# Patient Record
Sex: Male | Born: 2000 | Race: Black or African American | Hispanic: Yes | Marital: Single | Smoking: Never smoker
Health system: Southern US, Community
[De-identification: ages and names within clinical notes are randomized; demographics above are authoritative.]

---
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
## 2019-07-25 ENCOUNTER — Other Ambulatory Visit: Payer: Self-pay

## 2019-07-25 ENCOUNTER — Ambulatory Visit (HOSPITAL_COMMUNITY)
Admission: EM | Admit: 2019-07-25 | Discharge: 2019-07-25 | Disposition: A | Discharge status: Home / Self Care | Payer: 59 | Attending: Family Medicine | Admitting: Family Medicine

## 2019-07-25 ENCOUNTER — Encounter (HOSPITAL_COMMUNITY): Payer: Self-pay

## 2019-07-25 DIAGNOSIS — H6982 Other specified disorders of Eustachian tube, left ear: Secondary | ICD-10-CM

## 2019-07-25 MED ORDER — FLUTICASONE PROPIONATE 50 MCG/ACT NA SUSP: 2.0000 | Freq: Every day | NASAL | 0 refills | Status: AC

## 2019-07-25 NOTE — ED Triage Notes (Signed)
Pt state he has left ear pain and sinus pressure. Pt states he thinks he has a ear infection. X 5 days.

## 2019-07-25 NOTE — Discharge Instructions (Addendum)
Drink plenty of fluids Use the flonase during allergy season Return as needed

## 2019-07-25 NOTE — ED Provider Notes (Signed)
[facility]    CSN: [id number] Arrival date & time: 07/25/19  1149      History   Chief Complaint Chief Complaint  Patient presents with  . Appointment    1230  . ear pain    HPI John Burton is a 19 y.o. male.   HPI   Sinus pressure and mild allergy symptoms chronically Now with ear pain for 5 d on L Pain comes and goes Hearing slightly down No fever or chills No purulent drainage   History reviewed. No pertinent past medical history.  Patient Active Problem List   Diagnosis Date Noted  . Appendicitis 04/18/2014  . Acute appendicitis 04/18/2014    Past Surgical History:  Procedure Laterality Date  . LAPAROSCOPIC APPENDECTOMY N/A 04/18/2014   Procedure: APPENDECTOMY LAPAROSCOPIC;  Surgeon: Judie Petit. Leonia Corona, MD;  Location: [facility];  Service: Pediatrics;  Laterality: N/A;       Home Medications    Prior to Admission medications   Medication Sig Start Date End Date Taking? Authorizing Provider  Chlorpheniramine Maleate (ALLERGY PO) Take 2 tablets by mouth daily as needed (allergies).    [provider]  fluticasone (FLONASE) 50 MCG/ACT nasal spray Place 2 sprays into both nostrils daily. 07/25/19   Eustace Moore, MD    Family History History reviewed. No pertinent family history.  Social History Social History   Tobacco Use  . Smoking status: Never Smoker  . Smokeless tobacco: Never Used  Substance Use Topics  . Alcohol use: No  . Drug use: No     Allergies   Shellfish allergy   Review of Systems Review of Systems  Constitutional: Negative for chills and fever.  HENT: Positive for congestion, ear pain, hearing loss, rhinorrhea and sinus pressure. Negative for sore throat.   Respiratory: Negative for cough.   Musculoskeletal: Negative for myalgias.  Neurological: Negative for headaches.     Physical Exam Triage Vital Signs ED Triage Vitals  Enc Vitals Group     BP 07/25/19 1209 131/70     Pulse Rate  07/25/19 1209 93     Resp 07/25/19 1209 16     Temp 07/25/19 1209 98.4 F (36.9 C)     Temp Source 07/25/19 1209 Oral     SpO2 07/25/19 1209 98 %     Weight 07/25/19 1208 176 lb (79.8 kg)     Height --      Head Circumference --      Peak Flow --      Pain Score 07/25/19 1208 4     Pain Loc --      Pain Edu? --      Excl. in GC? --    No data found.  Updated Vital Signs BP 131/70 (BP Location: Right Arm)   Pulse 93   Temp 98.4 F (36.9 C) (Oral)   Resp 16   Wt 79.8 kg   SpO2 98%      Physical Exam Constitutional:      General: He is not in acute distress.    Appearance: He is well-developed and normal weight.  HENT:     Head: Normocephalic and atraumatic.     Right Ear: Tympanic membrane, ear canal and external ear normal.     Left Ear: Tympanic membrane, ear canal and external ear normal.     Nose: Congestion present.     Mouth/Throat:     Mouth: Mucous membranes are moist.     Pharynx: No posterior oropharyngeal  erythema.  Eyes:     Conjunctiva/sclera: Conjunctivae normal.     Pupils: Pupils are equal, round, and reactive to light.  Cardiovascular:     Rate and Rhythm: Normal rate.  Pulmonary:     Effort: Pulmonary effort is normal. No respiratory distress.  Musculoskeletal:        General: Normal range of motion.     Cervical back: Normal range of motion.  Lymphadenopathy:     Cervical: No cervical adenopathy.  Skin:    General: Skin is warm and dry.  Neurological:     Mental Status: He is alert.  Psychiatric:        Mood and Affect: Mood normal.        Behavior: Behavior normal.      UC Treatments / Results  Labs (all labs ordered are listed, but only abnormal results are displayed) Labs Reviewed - No data to display  EKG   Radiology No results found.  Procedures Procedures (including critical care time)  Medications Ordered in UC Medications - No data to display  Initial Impression / Assessment and Plan / UC Course  I have reviewed  the triage vital signs and the nursing notes.  Pertinent labs & imaging results that were available during my care of the patient were reviewed by me and considered in my medical decision making (see chart for details).     TMs are clear Final Clinical Impressions(s) / UC Diagnoses   Final diagnoses:  Eustachian tube dysfunction, left     Discharge Instructions     Drink plenty of fluids Use the flonase during allergy season Return as needed   ED Prescriptions    Medication Sig Dispense Auth. Provider   fluticasone (FLONASE) 50 MCG/ACT nasal spray Place 2 sprays into both nostrils daily. 16 g Raylene Everts, MD     PDMP not reviewed this encounter.   Raylene Everts, MD 07/25/19 1320

## 2021-02-19 ENCOUNTER — Encounter (HOSPITAL_COMMUNITY): Payer: Self-pay | Admitting: Psychiatry

## 2021-02-19 ENCOUNTER — Telehealth (HOSPITAL_COMMUNITY): Payer: 59 | Admitting: Psychiatry

## 2021-02-19 ENCOUNTER — Telehealth (INDEPENDENT_AMBULATORY_CARE_PROVIDER_SITE_OTHER): Payer: 59 | Admitting: Psychiatry

## 2021-02-19 DIAGNOSIS — F250 Schizoaffective disorder, bipolar type: Secondary | ICD-10-CM

## 2021-02-19 DIAGNOSIS — F411 Generalized anxiety disorder: Secondary | ICD-10-CM

## 2021-02-19 MED ORDER — FLUOXETINE HCL 10 MG PO CAPS: 10.0000 mg | ORAL_CAPSULE | Freq: Every day | ORAL | 3 refills | Status: DC

## 2021-02-19 MED ORDER — HYDROXYZINE HCL 10 MG PO TABS: 10.0000 mg | ORAL_TABLET | Freq: Three times a day (TID) | ORAL | 3 refills | Status: DC | PRN

## 2021-02-19 MED ORDER — DIVALPROEX SODIUM ER 500 MG PO TB24: 500.0000 mg | ORAL_TABLET | Freq: Three times a day (TID) | ORAL | 3 refills | Status: DC

## 2021-02-19 MED ORDER — PALIPERIDONE PALMITATE ER 156 MG/ML IM SUSY: 156.0000 mg | PREFILLED_SYRINGE | Freq: Once | INTRAMUSCULAR | Status: DC

## 2021-02-19 MED ORDER — INVEGA SUSTENNA 156 MG/ML IM SUSY: 156.0000 mg | PREFILLED_SYRINGE | INTRAMUSCULAR | 11 refills | Status: DC
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
## 2021-03-07 ENCOUNTER — Telehealth (HOSPITAL_COMMUNITY): Payer: Self-pay | Admitting: Psychiatry

## 2021-03-07 ENCOUNTER — Other Ambulatory Visit: Payer: Self-pay

## 2021-03-07 ENCOUNTER — Ambulatory Visit (HOSPITAL_COMMUNITY)
Admission: EM | Admit: 2021-03-07 | Discharge: 2021-03-09 | Disposition: A | Discharge status: Home / Self Care | Payer: 59 | Attending: Registered Nurse | Admitting: Registered Nurse

## 2021-03-07 ENCOUNTER — Encounter (HOSPITAL_COMMUNITY): Payer: Self-pay | Admitting: Registered Nurse

## 2021-03-07 DIAGNOSIS — F250 Schizoaffective disorder, bipolar type: Secondary | ICD-10-CM | POA: Diagnosis present

## 2021-03-07 DIAGNOSIS — F411 Generalized anxiety disorder: Secondary | ICD-10-CM | POA: Diagnosis present

## 2021-03-07 DIAGNOSIS — Y93E1 Activity, personal bathing and showering: Secondary | ICD-10-CM | POA: Insufficient documentation

## 2021-03-07 DIAGNOSIS — F1290 Cannabis use, unspecified, uncomplicated: Secondary | ICD-10-CM | POA: Diagnosis not present

## 2021-03-07 DIAGNOSIS — Z20822 Contact with and (suspected) exposure to covid-19: Secondary | ICD-10-CM | POA: Diagnosis not present

## 2021-03-07 DIAGNOSIS — G8929 Other chronic pain: Secondary | ICD-10-CM | POA: Diagnosis not present

## 2021-03-07 DIAGNOSIS — Y929 Unspecified place or not applicable: Secondary | ICD-10-CM | POA: Diagnosis not present

## 2021-03-07 DIAGNOSIS — F17210 Nicotine dependence, cigarettes, uncomplicated: Secondary | ICD-10-CM | POA: Insufficient documentation

## 2021-03-07 DIAGNOSIS — Z79899 Other long term (current) drug therapy: Secondary | ICD-10-CM | POA: Diagnosis not present

## 2021-03-07 DIAGNOSIS — M545 Low back pain, unspecified: Secondary | ICD-10-CM | POA: Insufficient documentation

## 2021-03-07 DIAGNOSIS — W182XXA Fall in (into) shower or empty bathtub, initial encounter: Secondary | ICD-10-CM | POA: Insufficient documentation

## 2021-03-07 LAB — CBC WITH DIFFERENTIAL/PLATELET
Abs Immature Granulocytes: 0.03 10*3/uL (ref 0.00–0.07)
Basophils Absolute: 0 10*3/uL (ref 0.0–0.1)
Basophils Relative: 0 %
Eosinophils Absolute: 0.1 10*3/uL (ref 0.0–0.5)
Eosinophils Relative: 2 %
HCT: 42.6 % (ref 39.0–52.0)
Hemoglobin: 14.2 g/dL (ref 13.0–17.0)
Immature Granulocytes: 0 %
Lymphocytes Relative: 16 %
Lymphs Abs: 1.1 10*3/uL (ref 0.7–4.0)
MCH: 30.9 pg (ref 26.0–34.0)
MCHC: 33.3 g/dL (ref 30.0–36.0)
MCV: 92.6 fL (ref 80.0–100.0)
Monocytes Absolute: 1.3 10*3/uL — ABNORMAL HIGH (ref 0.1–1.0)
Monocytes Relative: 19 %
Neutro Abs: 4.3 10*3/uL (ref 1.7–7.7)
Neutrophils Relative %: 63 %
Platelets: 195 10*3/uL (ref 150–400)
RBC: 4.6 MIL/uL (ref 4.22–5.81)
RDW: 14.2 % (ref 11.5–15.5)
WBC: 6.8 10*3/uL (ref 4.0–10.5)
nRBC: 0 % (ref 0.0–0.2)

## 2021-03-07 LAB — COMPREHENSIVE METABOLIC PANEL
ALT: 22 U/L (ref 0–44)
AST: 36 U/L (ref 15–41)
Albumin: 3.9 g/dL (ref 3.5–5.0)
Alkaline Phosphatase: 65 U/L (ref 38–126)
Anion gap: 9 (ref 5–15)
BUN: 22 mg/dL — ABNORMAL HIGH (ref 6–20)
CO2: 24 mmol/L (ref 22–32)
Calcium: 9.1 mg/dL (ref 8.9–10.3)
Chloride: 105 mmol/L (ref 98–111)
Creatinine, Ser: 1.21 mg/dL (ref 0.61–1.24)
GFR, Estimated: >60 mL/min (ref >60)
Glucose, Bld: 109 mg/dL — ABNORMAL HIGH (ref 70–99)
Potassium: 4.2 mmol/L (ref 3.5–5.1)
Sodium: 138 mmol/L (ref 135–145)
Total Bilirubin: 0.7 mg/dL (ref 0.3–1.2)
Total Protein: 6.1 g/dL — ABNORMAL LOW (ref 6.5–8.1)

## 2021-03-07 LAB — URINALYSIS, ROUTINE W REFLEX MICROSCOPIC
Bilirubin Urine: NEGATIVE
Glucose, UA: NEGATIVE mg/dL
Hgb urine dipstick: NEGATIVE
Ketones, ur: NEGATIVE mg/dL
Leukocytes,Ua: NEGATIVE
Nitrite: NEGATIVE
Protein, ur: NEGATIVE mg/dL
Specific Gravity, Urine: 1.011 (ref 1.005–1.030)
pH: 7 (ref 5.0–8.0)

## 2021-03-07 LAB — POCT URINE DRUG SCREEN - MANUAL ENTRY (I-SCREEN)
POC Amphetamine UR: NOT DETECTED
POC Buprenorphine (BUP): NOT DETECTED
POC Cocaine UR: NOT DETECTED
POC Marijuana UR: POSITIVE — AB
POC Methadone UR: NOT DETECTED
POC Methamphetamine UR: NOT DETECTED
POC Morphine: NOT DETECTED
POC Oxazepam (BZO): NOT DETECTED
POC Oxycodone UR: NOT DETECTED
POC Secobarbital (BAR): NOT DETECTED

## 2021-03-07 LAB — RESP PANEL BY RT-PCR (FLU A&B, COVID) ARPGX2
Influenza A by PCR: NEGATIVE
Influenza B by PCR: NEGATIVE
SARS Coronavirus 2 by RT PCR: NEGATIVE

## 2021-03-07 LAB — LIPID PANEL
Cholesterol: 136 mg/dL (ref 0–200)
HDL: 41 mg/dL (ref >40)
LDL Cholesterol: 72 mg/dL (ref 0–99)
Total CHOL/HDL Ratio: 3.3 RATIO
Triglycerides: 116 mg/dL (ref <150)
VLDL: 23 mg/dL (ref 0–40)

## 2021-03-07 LAB — HEMOGLOBIN A1C
Hgb A1c MFr Bld: 5.8 % — ABNORMAL HIGH (ref 4.8–5.6)
Mean Plasma Glucose: 119.76 mg/dL

## 2021-03-07 LAB — ETHANOL: Alcohol, Ethyl (B): <10 mg/dL (ref <10)

## 2021-03-07 LAB — VALPROIC ACID LEVEL: Valproic Acid Lvl: 119 ug/mL — ABNORMAL HIGH (ref 50.0–100.0)

## 2021-03-07 LAB — POC SARS CORONAVIRUS 2 AG -  ED: SARS Coronavirus 2 Ag: NEGATIVE

## 2021-03-07 LAB — MAGNESIUM: Magnesium: 1.9 mg/dL (ref 1.7–2.4)

## 2021-03-07 LAB — TSH: TSH: 2.787 u[IU]/mL (ref 0.350–4.500)

## 2021-03-07 MED ORDER — TRAZODONE HCL 50 MG PO TABS
50.0000 mg | ORAL_TABLET | Freq: Every evening | ORAL | Status: DC | PRN
  Administered 2021-03-07 – 2021-03-08 (×2): 50 mg via ORAL
  Filled 2021-03-07 (×2): qty 1

## 2021-03-07 MED ORDER — FLUOXETINE HCL 10 MG PO CAPS
10.0000 mg | ORAL_CAPSULE | Freq: Every day | ORAL | Status: DC
  Administered 2021-03-07 – 2021-03-09 (×3): 10 mg via ORAL
  Filled 2021-03-07 (×3): qty 1

## 2021-03-07 MED ORDER — HYDROXYZINE HCL 25 MG PO TABS
25.0000 mg | ORAL_TABLET | Freq: Three times a day (TID) | ORAL | Status: DC | PRN
  Administered 2021-03-07 – 2021-03-09 (×5): 25 mg via ORAL
  Filled 2021-03-07 (×5): qty 1

## 2021-03-07 MED ORDER — ALUM & MAG HYDROXIDE-SIMETH 200-200-20 MG/5ML PO SUSP: 30.0000 mL | ORAL | Status: DC | PRN

## 2021-03-07 MED ORDER — MAGNESIUM HYDROXIDE 400 MG/5ML PO SUSP: 30.0000 mL | Freq: Every day | ORAL | Status: DC | PRN

## 2021-03-07 MED ORDER — ACETAMINOPHEN 325 MG PO TABS
650.0000 mg | ORAL_TABLET | Freq: Four times a day (QID) | ORAL | Status: DC | PRN
  Administered 2021-03-07 – 2021-03-09 (×5): 650 mg via ORAL
  Filled 2021-03-07 (×5): qty 2

## 2021-03-07 MED ORDER — DIVALPROEX SODIUM ER 500 MG PO TB24
500.0000 mg | ORAL_TABLET | Freq: Three times a day (TID) | ORAL | Status: DC
Start: 2021-03-07 — End: 2021-03-08
  Administered 2021-03-07 – 2021-03-08 (×3): 500 mg via ORAL
  Filled 2021-03-07 (×3): qty 1

## 2021-03-07 MED ORDER — NICOTINE 14 MG/24HR TD PT24
14.0000 mg | MEDICATED_PATCH | Freq: Every day | TRANSDERMAL | Status: DC
  Administered 2021-03-07 – 2021-03-09 (×3): 14 mg via TRANSDERMAL
  Filled 2021-03-07 (×4): qty 1

## 2021-03-07 NOTE — ED Notes (Signed)
Pt sleeping@this time. Breathing even and unlabored. Will continue to monitor for safety 

## 2021-03-07 NOTE — ED Notes (Signed)
Pt a&O x 4, no distress noted. Pt calm & cooperative.  Monitoring for safety.

## 2021-03-07 NOTE — Telephone Encounter (Signed)
Patient and parents, Noel Christmas report medication is not working since mother was staying with him since Friday 03/02/21. Lives with aunts.  Call pt mother [phone].

## 2021-03-07 NOTE — ED Provider Notes (Signed)
Behavioral Health Admission [facility] & OBS)  Date: 03/07/21 Patient Name: John Burton MRN: [id number] Chief Complaint:  Chief Complaint  Patient presents with   Schizophrenia    Pt was aggressive, increased emotional outbursts when talking with mother.  Slapped      Diagnoses:  Final diagnoses:  Schizoaffective disorder, bipolar type ([place])  Generalized anxiety disorder    HPI: John Burton, 20 y.o., male patient presents to [facility] as a walk in accompanied by his parents with complaints of worsening aggression, irritability, and that his medication is not working.  Patient seen face to face by this provider, consulted with Dr. Ernie Hew; and chart reviewed on 03/07/21.  On evaluation John Burton reports he was brought to urgent care because of his emotional outburst, worsening aggression, and feeling that his medication is not working.  Patient reports he is easily agitated when he has gained up on by his mother and 2 aunts.  "My mother and 2 aunts getting up on me like clicking hands and they incite my anger."  Patient reports he has outpatient psychiatric services at [facility] with Eulis Canner.  Reports he is prescribed Depakote 500 mg 3 times a day and he gets the [place] injection.  Patient states he is taking his medications as prescribed and his next scheduled appointment is on 03/22/2021.  Patient denies suicidal/self-harm/homicidal ideations, psychosis, and paranoia.  Patient admits that he does have some paranoia feeling that his aunts are able to read his mind.  "I feel like they are reading my mind in my body chemistry that always pick up on everything.  And sometimes at home I want to walk because walking helps me relieve the stress or pacing and they tell me you not going to go walking and I am like if I want to walk I am going to walk do not go to tell me what to do I am doing this for stress release.  I  get tired of my mom and aunts belittling me"  Patient reports he does have a history of substance use disorder abusing his Adderall to help lose weight, marijuana, and alcohol.  Patient reports his last substance use was 02/20/2021.  During evaluation John Burton is alert/oriented x 4; calm and some what cooperative.  Labile mood at one moment laughing, angry, then sad.  Patient directed most of anger towards his mother stating that she is trying to tell him what to do and getting into his business.  Patient father is at side also but no anger directed towards father.  Thought content paranoia towards his mother and aunt reading his mind and trying to harm him.  He denies suicidal ideation but states at times he has homicidal thoughts toward his mother and aunts but no intent or plan.  States he wouldn't act on thoughts.  He denies auditory/visual hallucinations.  His behavior and reactions are somewhat bizarre at time it appears if he is being manipulative or attention seeking and other possible psychotic.  He denied any drug use since 02/20/21 but behaviors may also be related to CBD use; when first asked about CBD he hung head down and wouldn't answer the states he hasn't used any.  Patient will be admitted to continuous assessment unit for safety and stabilization.  Patient has scheduled appointment at [facility] 03/22/21 at 1:00 PM.     PHQ 2-9:  [place] ED from 03/07/2021 in [facility] Video Visit from  02/19/2021 in [facility]  Thoughts that you would be better off dead, or of hurting yourself in some way More than half the days Nearly every day  PHQ-9 Total Score 12 [place] ED from 03/07/2021 in [facility] Video Visit from 02/19/2021 in [place] No Risk Error: Q7 should not be populated when Q6 is No        Total Time spent  with patient: 45 minutes  Musculoskeletal  Strength & Muscle Tone: within normal limits Gait & Station: normal Patient leans: N/A  Psychiatric Specialty Exam  Presentation General Appearance: Appropriate for Environment  Eye Contact:Good  Speech:Clear and Coherent; Normal Rate  Speech Volume:Normal  Handedness:Right   Mood and Affect  Mood:Labile; Anxious  Affect:Labile   Thought Process  Thought Processes:Coherent; Goal Directed  Descriptions of Associations:Circumstantial  Orientation:Full (Time, Place and Person)  Thought Content:Paranoid Ideation  Diagnosis of Schizophrenia or Schizoaffective disorder in past: Yes  Duration of Psychotic Symptoms: Less than six months  Hallucinations:Hallucinations: None (Denies at this time)  Ideas of Reference:Paranoia  Suicidal Thoughts:Suicidal Thoughts: No (Denies at this time)  Homicidal Thoughts:Homicidal Thoughts: No (But states at times he has thoughts of hurting others (mother and aunts))   Sensorium  Memory:No data recorded [place]  Concentration:Good  Attention Span:Good  Archie of Knowledge:Good  Language:Good   Psychomotor Activity  Psychomotor Activity:Psychomotor Activity: Restlessness   Assets  Assets:Communication Skills; Desire for Improvement; Financial Resources/Insurance; Housing; Leisure Time; Resilience; Social Support   Sleep  Sleep:Sleep: Fair   Nutritional Assessment (For OBS and [facility] admissions only) Has the patient had a weight loss or gain of 10 pounds or more in the last 3 months?: Yes Has the patient had a decrease in food intake/or appetite?: Yes Does the patient have dental problems?: No Does the patient have eating habits or behaviors that may be indicators of an eating disorder including binging or inducing vomiting?: No Has the patient recently lost weight without trying?: 1 Has the patient been eating  poorly because of a decreased appetite?: 0 (States appetite is normal now and that he stays hungry) Malnutrition Screening Tool Score: 1   Physical Exam Vitals and nursing note reviewed.  Constitutional:      General: He is not in acute distress.    Appearance: Normal appearance. He is not ill-appearing.  Cardiovascular:     Rate and Rhythm: Normal rate.  Pulmonary:     Effort: Pulmonary effort is normal.  Musculoskeletal:        General: Normal range of motion.     Cervical back: Normal range of motion.  Skin:    General: Skin is warm and dry.  Neurological:     Mental Status: He is alert and oriented to person, place, and time.  Psychiatric:        Attention and Perception: Attention and perception normal. He does not perceive auditory or visual hallucinations.        Mood and Affect: Mood is anxious. Affect is labile.        Speech: Speech normal.        Behavior: Behavior normal. Behavior is cooperative.        Thought Content: Thought content is paranoid. Thought content is not delusional. Thought content does not include homicidal or suicidal ideation.        Cognition and  Memory: Cognition and memory normal.        Judgment: Judgment is impulsive.   Review of Systems  Constitutional: Negative.   HENT: Negative.    Eyes: Negative.   Respiratory: Negative.    Cardiovascular: Negative.   Gastrointestinal: Negative.   Genitourinary: Negative.   Musculoskeletal: Negative.   Skin: Negative.   Neurological: Negative.   Endo/Heme/Allergies: Negative.   Psychiatric/Behavioral:  Depression: Stable. Hallucinations: Denies. Substance abuse: History of cannabis, CB, and alcohol use. Suicidal ideas: Denies. The patient is nervous/anxious.    Blood pressure (!) 144/80, pulse 83, temperature 98.8 F (37.1 C), temperature source Oral, resp. rate 16, height 6' (1.829 m), weight 191 lb (86.6 kg), SpO2 100 %. Body mass index is 25.9 kg/m.  Past Psychiatric History: Schizoaffective  disorder bipolar type, GAD, substance use disorder   Is the patient at risk to self? No  Has the patient been a risk to self in the past 6 months? No .    Has the patient been a risk to self within the distant past? No   Is the patient a risk to others?  Possible risk to mother and aunts related to paranoia   Has the patient been a risk to others in the past 6 months? No   Has the patient been a risk to others within the distant past? No   Past Medical History: History reviewed. No pertinent past medical history.  Past Surgical History:  Procedure Laterality Date   LAPAROSCOPIC APPENDECTOMY N/A 04/18/2014   Procedure: APPENDECTOMY LAPAROSCOPIC;  Surgeon: Jerilynn Mages. Gerald Stabs, MD;  Location: [place];  Service: Pediatrics;  Laterality: N/A;    Family History: History reviewed. No pertinent family history.  Social History:  Social History   Socioeconomic History   Marital status: Single    Spouse name: Not on file   Number of children: Not on file   Years of education: Not on file   Highest education level: Not on file  Occupational History   Not on file  Tobacco Use   Smoking status: Never   Smokeless tobacco: Never  Substance and Sexual Activity   Alcohol use: No   Drug use: No   Sexual activity: Not on file  Other Topics Concern   Not on file  Social History Narrative   Not on file   Social Determinants of Health   Financial Resource Strain: Not on file  Food Insecurity: Not on file  Transportation Needs: Not on file  Physical Activity: Not on file  Stress: Not on file  Social Connections: Not on file  Intimate Partner Violence: Not on file    SDOH:  SDOH Screenings   Alcohol Screen: Not on file  Depression (PHQ2-9): Medium Risk   PHQ-2 Score: 12  Financial Resource Strain: Not on file  Food Insecurity: Not on file  Housing: Not on file  Physical Activity: Not on file  Social Connections: Not on file  Stress: Not on file  Tobacco Use: Low Risk    Smoking  Tobacco Use: Never   Smokeless Tobacco Use: Never  Transportation Needs: Not on file    Last Labs:  No visits with results within 6 Month(s) from this visit.  Latest known visit with results is:  Admission on 04/18/2014, Discharged on 04/19/2014  Component Date Value Ref Range Status   WBC 04/18/2014 11.6  4.5 - 13.5 K/uL Final   RBC 04/18/2014 4.41  3.80 - 5.20 MIL/uL Final   Hemoglobin 04/18/2014 13.8  11.0 -  14.6 g/dL Final   HCT 04/18/2014 39.8  33.0 - 44.0 % Final   MCV 04/18/2014 90.2  77.0 - 95.0 fL Final   MCH 04/18/2014 31.3  25.0 - 33.0 pg Final   MCHC 04/18/2014 34.7  31.0 - 37.0 g/dL Final   RDW 04/18/2014 12.7  11.3 - 15.5 % Final   Platelets 04/18/2014 231  150 - 400 K/uL Final   Neutrophils Relative % 04/18/2014 76 (A) 33 - 67 % Final   Neutro Abs 04/18/2014 8.9 (A) 1.5 - 8.0 K/uL Final   Lymphocytes Relative 04/18/2014 12 (A) 31 - 63 % Final   Lymphs Abs 04/18/2014 1.3 (A) 1.5 - 7.5 K/uL Final   Monocytes Relative 04/18/2014 11  3 - 11 % Final   Monocytes Absolute 04/18/2014 1.2  0.2 - 1.2 K/uL Final   Eosinophils Relative 04/18/2014 1  0 - 5 % Final   Eosinophils Absolute 04/18/2014 0.1  0.0 - 1.2 K/uL Final   Basophils Relative 04/18/2014 0  0 - 1 % Final   Basophils Absolute 04/18/2014 0.0  0.0 - 0.1 K/uL Final   Sodium 04/18/2014 140  137 - 147 mEq/L Final   Potassium 04/18/2014 3.7  3.7 - 5.3 mEq/L Final   Chloride 04/18/2014 101  96 - 112 mEq/L Final   CO2 04/18/2014 24  19 - 32 mEq/L Final   Glucose, Bld 04/18/2014 97  70 - 99 mg/dL Final   BUN 04/18/2014 12  6 - 23 mg/dL Final   Creatinine, Ser 04/18/2014 0.75  0.50 - 1.00 mg/dL Final   Calcium 04/18/2014 9.2  8.4 - 10.5 mg/dL Final   Total Protein 04/18/2014 7.1  6.0 - 8.3 g/dL Final   Albumin 04/18/2014 4.0  3.5 - 5.2 g/dL Final   AST 04/18/2014 20  0 - 37 U/L Final   ALT 04/18/2014 11  0 - 53 U/L Final   Alkaline Phosphatase 04/18/2014 232  74 - 390 U/L Final   Total Bilirubin 04/18/2014 1.4 (A)  0.3 - 1.2 mg/dL Final   GFR calc non Af Amer 04/18/2014 NOT CALCULATED  >90 mL/min Final   GFR calc Af Amer 04/18/2014 NOT CALCULATED  >90 mL/min Final   Comment: (NOTE) The eGFR has been calculated using the CKD EPI equation. This calculation has not been validated in all clinical situations. eGFR's persistently <90 mL/min signify possible Chronic Kidney Disease.    Anion gap 04/18/2014 15  5 - 15 Final   Lipase 04/18/2014 16  11 - 59 U/L Final    Allergies: Shellfish allergy  PTA Medications: (Not in a hospital admission)   Medical Decision Making  Patient admitted to Continuous Assessment Unit for safety and stabilization Labs Ordered:  Lab Orders         Resp Panel by RT-PCR (Flu A&B, Covid) Nasopharyngeal Swab         CBC with Differential/Platelet         Comprehensive metabolic panel         Hemoglobin A1c         Magnesium         Ethanol         Lipid panel         TSH         Urinalysis, Routine w reflex microscopic Urine, Clean Catch         Valproic acid level         POC SARS Coronavirus 2 Ag-ED - Nasal Swab  POCT Urine Drug Screen - ([facility])       Medication Management:  Restarted home medications added Trazodone for sleep and Vistaril for anxiety Scheduled Medications:  divalproex  500 mg Oral TID   FLUoxetine  10 mg Oral Daily   paliperidone  156 mg Intramuscular Once   PRN Medications:  acetaminophen, alum & mag hydroxide-simeth, hydrOXYzine, magnesium hydroxide, traZODone   Recommendations  Based on my evaluation the patient does not appear to have an emergency medical condition.  Jenny Omdahl, NP 03/07/21  4:24 PM

## 2021-03-07 NOTE — BH Assessment (Addendum)
Comprehensive Clinical Assessment (CCA) Note  03/07/2021 John Burton [id number]  Disposition:  Gave clinical report to [facility] Rankin, NP, who determined that Pt shall be admitted to [facility] for observation.  The patient demonstrates the following risk factors for suicide: Chronic risk factors for suicide include: psychiatric disorder of Schizoaffective Disorder, Bipolar Type . Acute risk factors for suicide include: family or marital conflict. Protective factors for this patient include: positive social support and positive therapeutic relationship. Considering these factors, the overall suicide risk at this point appears to be low. Patient is not appropriate for outpatient follow up.   Flowsheet Row ED from 03/07/2021 in [facility] Video Visit from 02/19/2021 in [facility]  C-SSRS RISK CATEGORY No Risk Error: Q7 should not be populated when Q6 is No      Pt's CSSRS score indicates no risk of suicidal behavior.  Chief Complaint:  Chief Complaint  Patient presents with   Schizophrenia    Pt was aggressive, increased emotional outbursts when talking with mother.  Slapped   Visit Diagnosis: Schizoaffective Disorder, Bipolar I Type; GAD  Narrative:  Pt is a 20 year old male who presented to [facility] as a voluntary walk-in with apparent delusion and also reports of increased aggression.  Pt is unemployed and lives with his mother.  Pt was a Consulting civil engineer at [employer], but left due to substance use and attendant grade decline.  Pt said that he does not have a regular outpatient psychiatrist.  He was recently discharged from [facility] inpatient where he was treated for symptoms related to Schizoaffective Disorder (paranoia and other symptoms).  Patient receives outpatient psychiatric services at [facility] center with [facility].  Reports he is prescribed Depakote 500 mg 3 times a day and he gets the [place]  injection.  Patient states he is taking his medications as prescribed and his next scheduled appointment is on 03/22/2021.   Pt came to [facility] today with parents.  Parents' chief complaint is that Pt is becoming increasingly aggressive and threatening toward mother.  They reported also that Pt has made statements such as ''I wish you never had me.''  Pt was noticeably agitated at these statements, and Pt denied current suicidal ideation.  Pt stated that he is angry because of ''clucking hens'' (meaning mother and two aunts) close in on him, ''pick'' at him, and force him to push mother away.  Pt accused mother and aunts of trying to harm him, suggesting paranoid delusion.  Pt was observed to be labile -- first angry, then tearful, and then laughing.  Pt endorsed irritability, despondency, aggressive behavior directed toward mother, and paranoid ideation.  He also endorsed insomnia and tearfulness. Pt denied current suicidal ideation and past suicide attempt.  He also denied homicidal ideation, but admitted to pushing mother.  Pt denied current substance use and self-injurious behavior.  Pt endorsed past use of THC, and he admitted to past abuse of adderall.  Pt reported that he was a Consulting civil engineer at [employer] but dropped out because of poor grades, which he attributed to marijuana use and mental health.  Pt stated also that he regularly takes prescribed Depakote and an Invega injection.  He said he is supposed to take Adderall but he does not tolerate the side effects.  During assessment, Pt presented as alert and oriented.  He had normal eye contact.  Demeanor was agitated, alternately sad and then menacing toward mother, and labile.  Affect was preoccupied with delusion  that mother was trying to harm him.  Mood was sad.  Speech was loud but otherwise normal.  Thought processes were within normal range.  Thought content suggested paranoid delusion.  Thought organization was circumstantial.  Memory and concentration  were fair.  Insight, judgment, and impulse control were fair to poor.  CCA Screening, Triage and Referral (STR)  Patient Reported Information How did you hear about John Burton? Family/Friend  What Is the Reason for Your Visit/Call Today? Schizoaffective disorder -- aggression  How Long Has This Been Causing You Problems? > than 6 months  What Do You Feel Would Help You the Most Today? Medication(s); Treatment for Depression or other mood problem   Have You Recently Had Any Thoughts About Hurting Yourself? Yes  Are You Planning to Commit Suicide/Harm Yourself At This time? No   Have you Recently Had Thoughts About Hurting Someone [name]? Yes  Are You Planning to Harm Someone at This Time? No  Explanation: No data recorded  Have You Used Any Alcohol or Drugs in the Past 24 Hours? No  How Long Ago Did You Use Drugs or Alcohol? No data recorded What Did You Use and How Much? No data recorded  Do You Currently Have a Therapist/Psychiatrist? No data recorded Name of Therapist/Psychiatrist: No data recorded  Have You Been Recently Discharged From Any Office Practice or Programs? No data recorded Explanation of Discharge From Practice/Program: No data recorded    CCA Screening Triage Referral Assessment Type of Contact: No data recorded Telemedicine Service Delivery:   Is this Initial or Reassessment? No data recorded Date Telepsych consult ordered in [facility]:  No data recorded Time Telepsych consult ordered in [facility]:  No data recorded Location of Assessment: No data recorded Provider Location: No data recorded  Collateral Involvement: No data recorded  Does Patient Have a Court Appointed Legal Guardian? No data recorded Name and Contact of Legal Guardian: No data recorded If Minor and Not Living with Parent(s), Who has Custody? No data recorded Is CPS involved or ever been involved? No data recorded Is APS involved or ever been involved? No data recorded  Patient Determined To Be At  Risk for Harm To Self or Others Based on Review of Patient Reported Information or Presenting Complaint? No data recorded Method: No data recorded Availability of Means: No data recorded Intent: No data recorded Notification Required: No data recorded Additional Information for Danger to Others Potential: No data recorded Additional Comments for Danger to Others Potential: No data recorded Are There Guns or Other Weapons in Your Home? No data recorded Types of Guns/Weapons: No data recorded Are These Weapons Safely Secured?                            No data recorded Who Could Verify You Are Able To Have These Secured: No data recorded Do You Have any Outstanding Charges, Pending Court Dates, Parole/Probation? No data recorded Contacted To Inform of Risk of Harm To Self or Others: No data recorded   Does Patient Present under Involuntary Commitment? No data recorded IVC Papers Initial File Date: No data recorded  [place] of Residence: No data recorded  Patient Currently Receiving the Following Services: No data recorded  Determination of Need: Urgent (48 hours)   Options For Referral: Medication Management; Inpatient Hospitalization; Outpatient Therapy     CCA Biopsychosocial Patient Reported Schizophrenia/Schizoaffective Diagnosis in Past: Yes   Strengths: Supportive family; currently lives with mom   Mental  Health Symptoms Depression:   Tearfulness; Irritability; Hopelessness; Sleep (too much or little)   Duration of Depressive symptoms:  Duration of Depressive Symptoms: Greater than two weeks   Mania:  No data recorded  Anxiety:   No data recorded  Psychosis:   Delusions (Mom and aunts are trying to harm him)   Duration of Psychotic symptoms:    Trauma:   None   Obsessions:   None   Compulsions:   None   Inattention:   None   Hyperactivity/Impulsivity:   None   Oppositional/Defiant Behaviors:   Aggression towards people/animals   Emotional  Irregularity:   Mood lability; Potentially harmful impulsivity   Other Mood/Personality Symptoms:  No data recorded   Mental Status Exam Appearance and self-care  Stature:   Tall   Weight:   Average weight   Clothing:   Casual   Grooming:   Normal   Cosmetic use:   None   Posture/gait:   Normal   Motor activity:   Not Remarkable   Sensorium  Attention:   Normal   Concentration:   Normal   Orientation:   X5   Recall/memory:   Normal   Affect and Mood  Affect:   Constricted   Mood:   Depressed; Dysphoric   Relating  Eye contact:   Normal   Facial expression:   Angry; Tense   Attitude toward examiner:   Cooperative   Thought and Language  Speech flow:  Clear and Coherent   Thought content:   Delusions   Preoccupation:   None   Hallucinations:   None   Organization:  No data recorded  [employer] of Knowledge:   Average   Intelligence:   Average   Abstraction:   Concrete   Judgement:   Poor   Reality Testing:   Distorted   Insight:   Poor   Decision Making:   Confused   Social Functioning  Social Maturity:   Irresponsible   Social Judgement:   Heedless   Stress  Stressors:   Family conflict   Coping Ability:   Overwhelmed   Skill Deficits:   None   Supports:   Family; Support needed     Religion:    Leisure/Recreation: Leisure / Recreation Do You Have Hobbies?: Yes Leisure and Hobbies: Watch TV; play videogames  Exercise/Diet: Exercise/Diet Do You Exercise?: Yes What Type of Exercise Do You Do?: Weight Training How Many Times a Week Do You Exercise?: 1-3 times a week Have You Gained or Lost A Significant Amount of Weight in the Past Six Months?: No Do You Follow a Special Diet?: No Do You Have Any Trouble Sleeping?: Yes Explanation of Sleeping Difficulties: Sensitive to caffeine; about 3 hours   CCA Employment/Education Employment/Work Situation: Employment / Work  Situation Employment Situation: Unemployed Patient's Job has Been Impacted by Current Illness: No Has Patient ever Been in Equities trader?: No  Education: Education Is Patient Currently Attending School?: No Did Theme park manager?: Yes What Type of College Degree Do you Have?: Has not completed college -- attended [employer] Did You Have An Individualized Education Program (IIEP): No Did You Have Any Difficulty At [employer]?: No Patient's Education Has Been Impacted by Current Illness: No   CCA Family/Childhood History Family and Relationship History: Family history Marital status: Single Does patient have children?: No  Childhood History:  Childhood History By whom was/is the patient raised?: Both parents  Child/Adolescent Assessment:     CCA Substance Use Alcohol/Drug Use:  Alcohol / Drug Use Pain Medications: Please see MAR Prescriptions: Please see MAR Over the Counter: Please see MAR History of alcohol / drug use?: Yes (Pt also smokes) Substance #1 Name of Substance 1: Alcohol 1 - Frequency: Weekly 1 - Duration: Ongoing 1 - Last Use / Amount: February 03, 2021 -- 6 shots 1 - Method of Aquiring: Street purchase 1- Route of Use: Oral ingestion Substance #2 Name of Substance 2: THC 2 - Amount (size/oz): a blunt 2 - Frequency: Daily 2 - Duration: Ongoing 2 - Last Use / Amount: February 03, 2021 -- a blunt 2 - Method of Aquiring: street purchase 2 - Route of Substance Use: Oral inhalation                     ASAM's:  Six Dimensions of Multidimensional Assessment  Dimension 1:  Acute Intoxication and/or Withdrawal Potential:   Dimension 1:  Description of individual's past and current experiences of substance use and withdrawal: Previous use  Dimension 2:  Biomedical Conditions and Complications:   Dimension 2:  Description of patient's biomedical conditions and  complications: None indicated  Dimension 3:  Emotional, Behavioral, or Cognitive Conditions and  Complications:  Dimension 3:  Description of emotional, behavioral, or cognitive conditions and complications: History of schizoaffective disorder  Dimension 4:  Readiness to Change:  Dimension 4:  Description of Readiness to Change criteria: Not really  Dimension 5:  Relapse, Continued use, or Continued Problem Potential:  Dimension 5:  Relapse, continued use, or continued problem potential critiera description: Moderate  Dimension 6:  Recovery/Living Environment:  Dimension 6:  Recovery/Iiving environment criteria description: Lived with mom -- conflictual with mom  ASAM Severity Score:    ASAM Recommended Level of Treatment: ASAM Recommended Level of Treatment: Level II Intensive Outpatient Treatment   Substance use Disorder (SUD) Substance Use Disorder (SUD)  Checklist Symptoms of Substance Use: Continued use despite having a persistent/recurrent physical/psychological problem caused/exacerbated by use  Recommendations for Services/Supports/Treatments:    Discharge Disposition:    DSM5 Diagnoses: Patient Active Problem List   Diagnosis Date Noted   Schizoaffective disorder, bipolar type (HCC) 02/19/2021   Generalized anxiety disorder 02/19/2021   Appendicitis 04/18/2014   Acute appendicitis 04/18/2014     Referrals to Alternative Service(s): Referred to Alternative Service(s):   Place:   Date:   Time:    Referred to Alternative Service(s):   Place:   Date:   Time:    Referred to Alternative Service(s):   Place:   Date:   Time:    Referred to Alternative Service(s):   Place:   Date:   Time:     Earline Mayotte, [facility]

## 2021-03-07 NOTE — ED Notes (Signed)
Patient was admitted today to continuous assessment. Patient reported he has some back pain and was treated with Tylenol PRN 650 mg.  Patient was given his scheduled medications. Patient was oriented to the unit. Patient has been cooperative with staff and easily redirected. Patient denied SI/HI and AVH. Patient has contracted for safety.

## 2021-03-08 MED ORDER — LORAZEPAM 1 MG PO TABS
1.0000 mg | ORAL_TABLET | Freq: Once | ORAL | Status: AC
  Administered 2021-03-08: 1 mg via ORAL
  Filled 2021-03-08: qty 1

## 2021-03-08 MED ORDER — PALIPERIDONE PALMITATE ER 234 MG/1.5ML IM SUSY: 234.0000 mg | PREFILLED_SYRINGE | Freq: Once | INTRAMUSCULAR | Status: DC

## 2021-03-08 MED ORDER — DIVALPROEX SODIUM ER 500 MG PO TB24
500.0000 mg | ORAL_TABLET | Freq: Two times a day (BID) | ORAL | Status: DC
  Administered 2021-03-08: 500 mg via ORAL
  Filled 2021-03-08 (×2): qty 1

## 2021-03-08 NOTE — ED Notes (Signed)
Pt came to nursing station and asked for staff to get his sweater out of the locker due to him being cold at night. Pt calm and cooperative but he is pacing

## 2021-03-08 NOTE — ED Notes (Signed)
Pt in shower.  

## 2021-03-08 NOTE — Telephone Encounter (Signed)
Provider attempted to call patient 3 times without success. Provider left voicemail informing patient of walk in hours. Provider also instructed patient and family to call the clinic again with other questions or concerns.

## 2021-03-08 NOTE — ED Notes (Signed)
Pt in shower at present, no distress noted.  Comfort measures given.  Monitoring for safety.

## 2021-03-08 NOTE — ED Notes (Signed)
Pt pacing and conversing with staff. Cooperative. Administered ativan per order. A&Ox4 with no acute signs of distress. Will continue to monitor for safety.

## 2021-03-08 NOTE — ED Notes (Signed)
Pt pacing room, constantly asking for something to eat. Pt just finished breakfast. Explained schedule for meal and snack times (posted on wall). Pt verbalized understanding. Denies SI/HI/AVH but bizarre behaviors noted. Pressured speech at times, intrusive, requires redirecting. Will continue to monitor for safety.

## 2021-03-08 NOTE — ED Notes (Signed)
Pt on phone with family, requesting to discharge. Provider notified. Safety maintained.

## 2021-03-08 NOTE — ED Provider Notes (Signed)
Behavioral Health Progress Note  Date and Time: 03/08/2021 9:41 AM Name: John Burton MRN:  [id number]  Subjective: Patient states "1 medication that I had not mentioned that I used was oxycodone, I used it in December 2015, December 2016, and December 2017, I did not use it in 2018."  Patient goes on to say "I still like it it makes you go to sleep and has calming effects."  Max presents with tangential conversation. Volume of speech elevated.  He reports readiness to discharge home.  He states "I became argumentative and confrontational with my mother, 2 other mother hens came in and started clucking, my aunts."  He lacks insight regarding his diagnosis.  He states "I am bulimic, I saw my stomach getting fatter, I need to get a home gym set."  He has been diagnosed with schizoaffective disorder, bipolar type.  He has a history of generalized anxiety disorder.  He is followed by outpatient psychiatry at [facility] behavioral health.  He reports compliance with current medications including Depakote and [place].  Patient is assessed face-to-face by nurse practitioner.  He is seated in observation area, no acute distress.  He is alert and oriented, cooperative during assessment.   He presents with anxious mood, labile affect. He denies suicidal and homicidal ideations.  He contracts verbally for safety with this Clinical research associate. He denies both auditory and visual hallucinations.  Patient is able to converse coherently with goal-directed thoughts.    Patient offered support and encouragement.  He gives verbal consent to speak with his mother, Noel Christmas phone number [phone] on his father, Sharlot Gowda phone number [phone].  Diagnosis:  Final diagnoses:  Schizoaffective disorder, bipolar type (HCC)  Generalized anxiety disorder    Total Time spent with patient: 20 minutes  Past Psychiatric History: generalized anxiety disorder, schizoaffective disorder Past Medical History:  History reviewed. No pertinent past medical history.  Past Surgical History:  Procedure Laterality Date   LAPAROSCOPIC APPENDECTOMY N/A 04/18/2014   Procedure: APPENDECTOMY LAPAROSCOPIC;  Surgeon: Judie Petit. Leonia Corona, MD;  Location: [facility] OR;  Service: Pediatrics;  Laterality: N/A;   Family History: History reviewed. No pertinent family history. Family Psychiatric  History: none reported Social History:  Social History   Substance and Sexual Activity  Alcohol Use No     Social History   Substance and Sexual Activity  Drug Use No    Social History   Socioeconomic History   Marital status: Single    Spouse name: Not on file   Number of children: Not on file   Years of education: Not on file   Highest education level: Not on file  Occupational History   Not on file  Tobacco Use   Smoking status: Never   Smokeless tobacco: Never  Substance and Sexual Activity   Alcohol use: No   Drug use: No   Sexual activity: Not on file  Other Topics Concern   Not on file  Social History Narrative   Not on file   Social Determinants of Health   Financial Resource Strain: Not on file  Food Insecurity: Not on file  Transportation Needs: Not on file  Physical Activity: Not on file  Stress: Not on file  Social Connections: Not on file   SDOH:  SDOH Screenings   Alcohol Screen: Not on file  Depression (PHQ2-9): Medium Risk   PHQ-2 Score: 12  Financial Resource Strain: Not on file  Food Insecurity: Not on file  Housing: Not on file  Physical Activity: Not  on file  Social Connections: Not on file  Stress: Not on file  Tobacco Use: Low Risk    Smoking Tobacco Use: Never   Smokeless Tobacco Use: Never  Transportation Needs: Not on file   Additional Social History:    Pain Medications: Please see MAR Prescriptions: Please see MAR Over the Counter: Please see MAR History of alcohol / drug use?: Yes (Pt also smokes) Name of Substance 1: Alcohol 1 - Frequency: Weekly 1 -  Duration: Ongoing 1 - Last Use / Amount: February 03, 2021 -- 6 shots 1 - Method of Aquiring: Street purchase 1- Route of Use: Oral ingestion Name of Substance 2: THC 2 - Amount (size/oz): a blunt 2 - Frequency: Daily 2 - Duration: Ongoing 2 - Last Use / Amount: February 03, 2021 -- a blunt 2 - Method of Aquiring: street purchase 2 - Route of Substance Use: Oral inhalation                Sleep: Fair  Appetite:  Good  Current Medications:  Current Facility-Administered Medications  Medication Dose Route Frequency Provider Last Rate Last Admin   acetaminophen (TYLENOL) tablet 650 mg  650 mg Oral Q6H PRN Rankin, Shuvon B, NP   650 mg at 03/08/21 0910   alum & mag hydroxide-simeth (MAALOX/MYLANTA) 200-200-20 MG/5ML suspension 30 mL  30 mL Oral Q4H PRN Rankin, Shuvon B, NP       divalproex (DEPAKOTE ER) 24 hr tablet 500 mg  500 mg Oral TID Rankin, Shuvon B, NP   500 mg at 03/08/21 0910   FLUoxetine (PROZAC) capsule 10 mg  10 mg Oral Daily Rankin, Shuvon B, NP   10 mg at 03/08/21 0911   hydrOXYzine (ATARAX/VISTARIL) tablet 25 mg  25 mg Oral TID PRN Rankin, Shuvon B, NP   25 mg at 03/07/21 2016   magnesium hydroxide (MILK OF MAGNESIA) suspension 30 mL  30 mL Oral Daily PRN Rankin, Shuvon B, NP       nicotine (NICODERM CQ - dosed in mg/24 hours) patch 14 mg  14 mg Transdermal Daily Rankin, Shuvon B, NP   14 mg at 03/07/21 1759   paliperidone (INVEGA SUSTENNA) injection 156 mg  156 mg Intramuscular Once Toy Cookey E, NP       traZODone (DESYREL) tablet 50 mg  50 mg Oral QHS PRN Rankin, Shuvon B, NP   50 mg at 03/07/21 2016   Current Outpatient Medications  Medication Sig Dispense Refill   amphetamine-dextroamphetamine (ADDERALL) 10 MG tablet Take 10-20 mg by mouth daily as needed (For ADHD.).     Ascorbic Acid (VITAMIN C PO) Take 1 tablet by mouth daily as needed (For immune health.).     divalproex (DEPAKOTE ER) 500 MG 24 hr tablet Take 1 tablet (500 mg total) by mouth 3  (three) times daily. 90 tablet 3   FLUoxetine (PROZAC) 10 MG capsule Take 1 capsule (10 mg total) by mouth daily. 30 capsule 3   fluticasone (FLONASE) 50 MCG/ACT nasal spray Place 2 sprays into both nostrils daily. (Patient taking differently: Place 2 sprays into both nostrils daily as needed for allergies.) 16 g 0   hydrOXYzine (ATARAX/VISTARIL) 10 MG tablet Take 1 tablet (10 mg total) by mouth 3 (three) times daily as needed. (Patient taking differently: Take 10 mg by mouth 3 (three) times daily as needed for anxiety.) 90 tablet 3   ibuprofen (ADVIL) 200 MG tablet Take 800 mg by mouth every 6 (six) hours as needed (For back  pain).     Lidocaine 4 % PTCH Apply 1 patch topically every 12 (twelve) hours as needed (For back pain).     paliperidone (INVEGA SUSTENNA) 156 MG/ML SUSY injection Inject 1 mL (156 mg total) into the muscle every 30 (thirty) days. 1 mL 11    Labs  Lab Results:  Admission on 03/07/2021  Component Date Value Ref Range Status   SARS Coronavirus 2 by RT PCR 03/07/2021 NEGATIVE  NEGATIVE Final   Comment: (NOTE) SARS-CoV-2 target nucleic acids are NOT DETECTED.  The SARS-CoV-2 RNA is generally detectable in upper respiratory specimens during the acute phase of infection. The lowest concentration of SARS-CoV-2 viral copies this assay can detect is 138 copies/mL. A negative result does not preclude SARS-Cov-2 infection and should not be used as the sole basis for treatment or other patient management decisions. A negative result may occur with  improper specimen collection/handling, submission of specimen other than nasopharyngeal swab, presence of viral mutation(s) within the areas targeted by this assay, and inadequate number of viral copies(<138 copies/mL). A negative result must be combined with clinical observations, patient history, and epidemiological information. The expected result is Negative.  Fact Sheet for Patients:   BloggerCourse.com  Fact Sheet for Healthcare Providers:  SeriousBroker.it  This test is no                          t yet approved or cleared by the [place] FDA and  has been authorized for detection and/or diagnosis of SARS-CoV-2 by FDA under an Emergency Use Authorization (EUA). This EUA will remain  in effect (meaning this test can be used) for the duration of the COVID-19 declaration under Section 564(b)(1) of the Act, 21 U.S.C.section 360bbb-3(b)(1), unless the authorization is terminated  or revoked sooner.       Influenza A by PCR 03/07/2021 NEGATIVE  NEGATIVE Final   Influenza B by PCR 03/07/2021 NEGATIVE  NEGATIVE Final   Comment: (NOTE) The Xpert Xpress SARS-CoV-2/FLU/RSV plus assay is intended as an aid in the diagnosis of influenza from Nasopharyngeal swab specimens and should not be used as a sole basis for treatment. Nasal washings and aspirates are unacceptable for Xpert Xpress SARS-CoV-2/FLU/RSV testing.  Fact Sheet for Patients: BloggerCourse.com  Fact Sheet for Healthcare Providers: SeriousBroker.it  This test is not yet approved or cleared by the [place] FDA and has been authorized for detection and/or diagnosis of SARS-CoV-2 by FDA under an Emergency Use Authorization (EUA). This EUA will remain in effect (meaning this test can be used) for the duration of the COVID-19 declaration under Section 564(b)(1) of the Act, 21 U.S.C. section 360bbb-3(b)(1), unless the authorization is terminated or revoked.  Performed at [facility], [address], [place], [place] [address]    SARS Coronavirus 2 Ag 03/07/2021 Negative  Negative Final   WBC 03/07/2021 6.8  4.0 - 10.5 K/uL Final   RBC 03/07/2021 4.60  4.22 - 5.81 MIL/uL Final   Hemoglobin 03/07/2021 14.2  13.0 - 17.0 g/dL Final   HCT 55/97/4163 42.6  39.0 - 52.0 % Final   MCV 03/07/2021 92.6   80.0 - 100.0 fL Final   MCH 03/07/2021 30.9  26.0 - 34.0 pg Final   MCHC 03/07/2021 33.3  30.0 - 36.0 g/dL Final   RDW 84/53/6468 14.2  11.5 - 15.5 % Final   Platelets 03/07/2021 195  150 - 400 K/uL Final   nRBC 03/07/2021 0.0  0.0 -  0.2 % Final   Neutrophils Relative % 03/07/2021 63  % Final   Neutro Abs 03/07/2021 4.3  1.7 - 7.7 K/uL Final   Lymphocytes Relative 03/07/2021 16  % Final   Lymphs Abs 03/07/2021 1.1  0.7 - 4.0 K/uL Final   Monocytes Relative 03/07/2021 19  % Final   Monocytes Absolute 03/07/2021 1.3 (A) 0.1 - 1.0 K/uL Final   Eosinophils Relative 03/07/2021 2  % Final   Eosinophils Absolute 03/07/2021 0.1  0.0 - 0.5 K/uL Final   Basophils Relative 03/07/2021 0  % Final   Basophils Absolute 03/07/2021 0.0  0.0 - 0.1 K/uL Final   Immature Granulocytes 03/07/2021 0  % Final   Abs Immature Granulocytes 03/07/2021 0.03  0.00 - 0.07 K/uL Final   Performed at [facility], [address], [place], [place] [address]   Sodium 03/07/2021 138  135 - 145 mmol/L Final   Potassium 03/07/2021 4.2  3.5 - 5.1 mmol/L Final   Chloride 03/07/2021 105  98 - 111 mmol/L Final   CO2 03/07/2021 24  22 - 32 mmol/L Final   Glucose, Bld 03/07/2021 109 (A) 70 - 99 mg/dL Final   Glucose reference range applies only to samples taken after fasting for at least 8 hours.   BUN 03/07/2021 22 (A) 6 - 20 mg/dL Final   Creatinine, Ser 03/07/2021 1.21  0.61 - 1.24 mg/dL Final   Calcium 60/45/4098 9.1  8.9 - 10.3 mg/dL Final   Total Protein 11/91/4782 6.1 (A) 6.5 - 8.1 g/dL Final   Albumin 95/62/1308 3.9  3.5 - 5.0 g/dL Final   AST 65/78/4696 36  15 - 41 U/L Final   ALT 03/07/2021 22  0 - 44 U/L Final   Alkaline Phosphatase 03/07/2021 65  38 - 126 U/L Final   Total Bilirubin 03/07/2021 0.7  0.3 - 1.2 mg/dL Final   GFR, Estimated 03/07/2021 >60  >60 mL/min Final   Comment: (NOTE) Calculated using the CKD-EPI Creatinine Equation (2021)    Anion gap 03/07/2021 9  5 - 15 Final   Performed at [facility], [address], [place], [place] [address]   Hgb A1c MFr Bld 03/07/2021 5.8 (A) 4.8 - 5.6 % Final   Comment: (NOTE) Pre diabetes:          5.7%-6.4%  Diabetes:              >6.4%  Glycemic control for   <7.0% adults with diabetes    Mean Plasma Glucose 03/07/2021 119.76  mg/dL Final   Performed at [facility], [address] [place], [place] [address]   Magnesium 03/07/2021 1.9  1.7 - 2.4 mg/dL Final   Performed at [facility], [address], [place], [place] [address]   Alcohol, Ethyl (B) 03/07/2021 <10  <10 mg/dL Final   Comment: (NOTE) Lowest detectable limit for serum alcohol is 10 mg/dL.  For medical purposes only. Performed at [facility], [address] [place], [place] [address]    Cholesterol 03/07/2021 136  0 - 200 mg/dL Final   Triglycerides 64/40/3474 116  <150 mg/dL Final   HDL 25/95/6387 41  >40 mg/dL Final   Total CHOL/HDL Ratio 03/07/2021 3.3  RATIO Final   VLDL 03/07/2021 23  0 - 40 mg/dL Final   LDL Cholesterol 03/07/2021 72  0 - 99 mg/dL Final   Comment:        Total Cholesterol/HDL:CHD Risk Coronary Heart Disease  Risk Table                     Men   Women  1/2 Average Risk   3.4   3.3  Average Risk       5.0   4.4  2 X Average Risk   9.6   7.1  3 X Average Risk  23.4   11.0        Use the calculated Patient Ratio above and the CHD Risk Table to determine the patient's CHD Risk.        ATP III CLASSIFICATION (LDL):  <100     mg/dL   Optimal  161-096  mg/dL   Near or Above                    Optimal  130-159  mg/dL   Borderline  045-409  mg/dL   High  >811     mg/dL   Very High Performed at [facility], [address] [place], [place] [address]    TSH 03/07/2021 2.787  0.350 - 4.500 uIU/mL Final   Comment: Performed by a 3rd Generation assay with a functional sensitivity of <=0.01 uIU/mL. Performed at [facility], [address], [place], [place] [address]    Color, Urine 03/07/2021 STRAW (A)  YELLOW Final   APPearance 03/07/2021 CLEAR  CLEAR Final   Specific Gravity, Urine 03/07/2021 1.011  1.005 - 1.030 Final   pH 03/07/2021 7.0  5.0 - 8.0 Final   Glucose, UA 03/07/2021 NEGATIVE  NEGATIVE mg/dL Final   Hgb urine dipstick 03/07/2021 NEGATIVE  NEGATIVE Final   Bilirubin Urine 03/07/2021 NEGATIVE  NEGATIVE Final   Ketones, ur 03/07/2021 NEGATIVE  NEGATIVE mg/dL Final   Protein, ur 13/12/6576 NEGATIVE  NEGATIVE mg/dL Final   Nitrite 46/96/2952 NEGATIVE  NEGATIVE Final   Leukocytes,Ua 03/07/2021 NEGATIVE  NEGATIVE Final   Performed at [facility], [address], [place], [place] [address]   POC Amphetamine UR 03/07/2021 None Detected  NONE DETECTED (Cut Off Level 1000 ng/mL) Final   POC Secobarbital (BAR) 03/07/2021 None Detected  NONE DETECTED (Cut Off Level 300 ng/mL) Final   POC Buprenorphine (BUP) 03/07/2021 None Detected  NONE DETECTED (Cut Off Level 10 ng/mL) Final   POC Oxazepam (BZO) 03/07/2021 None Detected  NONE DETECTED (Cut Off Level 300 ng/mL) Final   POC Cocaine UR 03/07/2021 None Detected  NONE DETECTED (Cut Off Level 300 ng/mL) Final   POC Methamphetamine UR 03/07/2021 None Detected  NONE DETECTED (Cut Off Level 1000 ng/mL) Final   POC Morphine 03/07/2021 None Detected  NONE DETECTED (Cut Off Level 300 ng/mL) Final   POC Oxycodone UR 03/07/2021 None Detected  NONE DETECTED (Cut Off Level 100 ng/mL) Final   POC Methadone UR 03/07/2021 None Detected  NONE DETECTED (Cut Off Level 300 ng/mL) Final   POC Marijuana UR 03/07/2021 Positive (A) NONE DETECTED (Cut Off Level 50 ng/mL) Final   Valproic Acid Lvl 03/07/2021 119 (A) 50.0 - 100.0 ug/mL Final   Performed at [facility], [address], [place], [place] [address]    Blood Alcohol level:  Lab Results  Component Value Date   [facility] [place] [facility] <10 03/07/2021    Metabolic Disorder Labs: Lab Results  Component Value Date   HGBA1C 5.8 (H) 03/07/2021   MPG 119.76 03/07/2021   No results found for: PROLACTIN Lab  Results  Component Value Date   CHOL 136 03/07/2021  TRIG 116 03/07/2021   HDL 41 03/07/2021   CHOLHDL 3.3 03/07/2021   VLDL 23 03/07/2021   LDLCALC 72 03/07/2021    Therapeutic Lab Levels: No results found for: LITHIUM Lab Results  Component Value Date   VALPROATE 119 (H) 03/07/2021   No components found for:  CBMZ  Physical Findings   GAD-7    Flowsheet Row Video Visit from 02/19/2021 in [facility]  Total GAD-7 Score 21      PHQ2-9    Flowsheet Row ED from 03/07/2021 in [facility] Video Visit from 02/19/2021 in [facility]  PHQ-2 Total Score 5 6  PHQ-9 Total Score 12 24      Flowsheet Row ED from 03/07/2021 in [facility] Video Visit from 02/19/2021 in [facility]  C-SSRS RISK CATEGORY Error: Q7 should not be populated when Q6 is No Error: Q7 should not be populated when Q6 is No        Musculoskeletal  Strength & Muscle Tone: within normal limits Gait & Station: normal Patient leans: N/A  Psychiatric Specialty Exam  Presentation  General Appearance: Appropriate for Environment  Eye Contact:Good  Speech:Clear and Coherent; Normal Rate  Speech Volume:Normal  Handedness:Right   Mood and Affect  Mood:Labile; Anxious  Affect:Labile   Thought Process  Thought Processes:Coherent; Goal Directed  Descriptions of Associations:Circumstantial  Orientation:Full (Time, Place and Person)  Thought Content:Paranoid Ideation  Diagnosis of Schizophrenia or Schizoaffective disorder in past: Yes  Duration of Psychotic Symptoms: Less than six months   Hallucinations:Hallucinations: None (Denies at this time)  Ideas of Reference:Paranoia  Suicidal Thoughts:Suicidal Thoughts: No (Denies at this time)  Homicidal Thoughts:Homicidal Thoughts: No (But states at times he has thoughts of hurting others (mother and  aunts))   Sensorium  Memory: No data recorded Judgment:Fair  Insight:Present   Executive Functions  Concentration:Good  Attention Span:Good  Recall:Good  Fund of Knowledge:Good  Language:Good   Psychomotor Activity  Psychomotor Activity:Psychomotor Activity: Restlessness   Assets  Assets:Communication Skills; Desire for Improvement; Financial Resources/Insurance; Housing; Leisure Time; Resilience; Social Support   Sleep  Sleep:Sleep: Fair   Nutritional Assessment (For OBS and FBC admissions only) Has the patient had a weight loss or gain of 10 pounds or more in the last 3 months?: Yes Has the patient had a decrease in food intake/or appetite?: Yes Does the patient have dental problems?: No Does the patient have eating habits or behaviors that may be indicators of an eating disorder including binging or inducing vomiting?: No Has the patient recently lost weight without trying?: 1 Has the patient been eating poorly because of a decreased appetite?: 0 (States appetite is normal now and that he stays hungry) Malnutrition Screening Tool Score: 1    Physical Exam  Physical Exam Vitals and nursing note reviewed.  Constitutional:      Appearance: Normal appearance. He is well-developed and normal weight.  HENT:     Head: Normocephalic and atraumatic.     Nose: Nose normal.  Cardiovascular:     Rate and Rhythm: Normal rate.  Pulmonary:     Effort: Pulmonary effort is normal.  Musculoskeletal:        General: Normal range of motion.     Cervical back: Normal range of motion.  Skin:    General: Skin is warm and dry.  Neurological:     Mental Status: He is alert and oriented to person, place, and time.  Psychiatric:        Attention and Perception: Attention normal.        Mood and Affect: Affect normal. Mood is anxious.        Speech: Speech is tangential.        Behavior: Behavior is cooperative.        Thought Content: Thought content is paranoid.         Judgment: Judgment is inappropriate.   Review of Systems  Constitutional: Negative.   HENT: Negative.    Eyes: Negative.   Respiratory: Negative.    Cardiovascular: Negative.   Gastrointestinal: Negative.   Genitourinary: Negative.   Musculoskeletal: Negative.   Skin: Negative.   Neurological: Negative.   Endo/Heme/Allergies: Negative.   Psychiatric/Behavioral:  The patient is nervous/anxious.   Blood pressure 112/70, pulse 65, temperature 98.7 F (37.1 C), temperature source Oral, resp. rate 16, height 6' (1.829 m), weight 191 lb (86.6 kg), SpO2 100 %. Body mass index is 25.9 kg/m.  Treatment Plan Summary: Patient reviewed with Dr. Bronwen Betters. Valproic acid  level on 03/07/2021  measured 119.  We will decrease Depakote 500 from 3 times daily to 2 times daily.  Recommend recheck Depakote level. Laboratory studies reviewed including CBC, CMP, TSH, lipid panel within defined limits. EKG completed on 03/07/2021, QTcB measured 364.  Urine drug screen positive for marijuana only on 03/07/2021. It appears patient received initial Invega Sustenna 234 mg IM on 02/10/2021.  Per medical record plan to receive additional 156 mg of [place] within the week however it appears he did not receive the second dose during the initiation phase. Will order Invega sustenna 234 mg IM.   Medication management -Depakote ER 500mg  BID - Sustenna IM 234 mg Hinda Glatter, FNP 03/08/2021 9:41 AM

## 2021-03-09 DIAGNOSIS — F411 Generalized anxiety disorder: Secondary | ICD-10-CM

## 2021-03-09 DIAGNOSIS — F250 Schizoaffective disorder, bipolar type: Secondary | ICD-10-CM | POA: Diagnosis not present

## 2021-03-09 MED ORDER — TRAZODONE HCL 50 MG PO TABS
50.0000 mg | ORAL_TABLET | Freq: Once | ORAL | Status: AC
  Administered 2021-03-09: 50 mg via ORAL
  Filled 2021-03-09: qty 1

## 2021-03-09 MED ORDER — HYDROXYZINE HCL 25 MG PO TABS
25.0000 mg | ORAL_TABLET | Freq: Once | ORAL | Status: AC
  Administered 2021-03-09: 25 mg via ORAL
  Filled 2021-03-09: qty 1

## 2021-03-09 MED ORDER — DIVALPROEX SODIUM ER 500 MG PO TB24: 500.0000 mg | ORAL_TABLET | Freq: Two times a day (BID) | ORAL | 0 refills | Status: DC

## 2021-03-09 MED ORDER — PALIPERIDONE PALMITATE ER 234 MG/1.5ML IM SUSY
234.0000 mg | PREFILLED_SYRINGE | Freq: Once | INTRAMUSCULAR | Status: AC
  Administered 2021-03-09: 234 mg via INTRAMUSCULAR

## 2021-03-09 MED ORDER — TRAZODONE HCL 50 MG PO TABS: 50.0000 mg | ORAL_TABLET | Freq: Every evening | ORAL | 0 refills | Status: DC | PRN

## 2021-03-09 MED ORDER — PALIPERIDONE PALMITATE ER 234 MG/1.5ML IM SUSY: 234.0000 mg | PREFILLED_SYRINGE | INTRAMUSCULAR | 0 refills | Status: DC

## 2021-03-09 MED ORDER — HYDROXYZINE HCL 25 MG PO TABS: 25.0000 mg | ORAL_TABLET | Freq: Three times a day (TID) | ORAL | 0 refills | Status: DC | PRN

## 2021-03-09 MED ORDER — LIDOCAINE 4 % EX PTCH: 1.0000 | MEDICATED_PATCH | Freq: Two times a day (BID) | CUTANEOUS | 0 refills | Status: AC | PRN

## 2021-03-09 MED ORDER — PALIPERIDONE PALMITATE ER 234 MG/1.5ML IM SUSY: 234.0000 mg | PREFILLED_SYRINGE | INTRAMUSCULAR | Status: DC

## 2021-03-09 NOTE — ED Provider Notes (Signed)
FBC/OBS ASAP Discharge Summary  Date and Time: 03/09/2021 12:40 PM  Name: John Burton  MRN:  [id number]   Discharge Diagnoses:  Final diagnoses:  Schizoaffective disorder, bipolar type (HCC)  Generalized anxiety disorder    Subjective: Patient states "I am ready to discharge home."  Patient states "I got better, I worked out my issues these days that I have been here."  John Burton describes difficulty in coping with his family states "when they get confrontational with me or they get aggressive with me I get aggressive with them." John Burton reports he fell in his shower earlier today, he reports chronic lower back pain.  Request lidocaine patch once discharged. He denies any loss of consciousness, any dizziness, declines emergency department evaluation.  Patient is assessed face-to-face by nurse practitioner.  He is seated in observation area, no acute distress.  He is alert and oriented, pleasant and cooperative during assessment.  He presents with euthymic mood, congruent affect. He denies suicidal and homicidal ideations.  He contracts verbally for safety with this Clinical research associate.   He has normal speech and behavior.  He denies both auditory and visual hallucinations.  Patient is able to converse coherently with goal-directed thoughts and no distractibility or preoccupation.  Objectively there is no evidence of psychosis/mania or delusional thinking.  John Burton resides in [place] with his aunt.  He denies access to weapons.  He is not currently employed.  He endorses chronic marijuana use.  He lacks insight regarding use of marijuana.  He states "I would like to go back to [place] where marijuana is illegal, I would like to get a medical marijuana card because I am not going to stop smoking marijuana, weed takes the stress off of me."  Additionally he uses nicotine, he is not currently ready to consider stopping the use of nicotine.  He smokes cigarettes as well as vapes. He denies alcohol use, denies  substance use aside from marijuana.  Discussed stopping the use of marijuana, particularly when prescribed psychotropic medications.  Patient has been diagnosed with schizoaffective disorder.  His plan is to follow-up with outpatient psychiatry at [facility] behavioral health.    Stay Summary: HPI from 03/07/2021 at 1621pm: John Burton, 20 y.o., male patient presents to [facility] as a walk in accompanied by his parents with complaints of worsening aggression, irritability, and that his medication is not working.   Patient seen face to face by this provider, consulted with Dr. Earlene Plater; and chart reviewed on 03/07/21.  On evaluation John Burton reports he was brought to urgent care because of his emotional outburst, worsening aggression, and feeling that his medication is not working.  Patient reports he is easily agitated when he has gained up on by his mother and 2 aunts.  "My mother and 2 aunts getting up on me like clicking hands and they incite my anger."  Patient reports he has outpatient psychiatric services at [facility] behavioral health center with [facility].  Reports he is prescribed Depakote 500 mg 3 times a day and he gets the [place] injection.  Patient states he is taking his medications as prescribed and his next scheduled appointment is on 03/22/2021.  Patient denies suicidal/self-harm/homicidal ideations, psychosis, and paranoia.  Patient admits that he does have some paranoia feeling that his aunts are able to read his mind.  "I feel like they are reading my mind in my body chemistry that always pick up on everything.  And sometimes at home I want to walk because  walking helps me relieve the stress or pacing and they tell me you not going to go walking and I am like if I want to walk I am going to walk do not go to tell me what to do I am doing this for stress release.  I get tired of my mom and aunts belittling me"  Patient reports he does  have a history of substance use disorder abusing his Adderall to help lose weight, marijuana, and alcohol.  Patient reports his last substance use was 02/20/2021.   During evaluation John Burton is alert/oriented x 4; calm and some what cooperative.  Labile mood at one moment laughing, angry, then sad.  Patient directed most of anger towards his mother stating that she is trying to tell him what to do and getting into his business.  Patient father is at side also but no anger directed towards father.  Thought content paranoia towards his mother and aunt reading his mind and trying to harm him.  He denies suicidal ideation but states at times he has homicidal thoughts toward his mother and aunts but no intent or plan.  States he wouldn't act on thoughts.  He denies auditory/visual hallucinations.  His behavior and reactions are somewhat bizarre at time it appears if he is being manipulative or attention seeking and other possible psychotic.  He denied any drug use since 02/20/21 but behaviors may also be related to CBD use; when first asked about CBD he hung head down and wouldn't answer the states he hasn't used any.  Patient will be admitted to continuous assessment unit for safety and stabilization.  Patient has scheduled appointment at [facility] 03/22/21 at 1:00 PM.     Total Time spent with patient: 45 minutes  Past Psychiatric History: Generalized anxiety disorder, schizoaffective disorder Past Medical History: History reviewed. No pertinent past medical history.  Past Surgical History:  Procedure Laterality Date   LAPAROSCOPIC APPENDECTOMY N/A 04/18/2014   Procedure: APPENDECTOMY LAPAROSCOPIC;  Surgeon: Judie Petit. Leonia Corona, MD;  Location: [facility] OR;  Service: Pediatrics;  Laterality: N/A;   Family History: History reviewed. No pertinent family history. Family Psychiatric History: None reported Social History:  Social History   Substance and Sexual Activity  Alcohol Use No     Social  History   Substance and Sexual Activity  Drug Use No    Social History   Socioeconomic History   Marital status: Single    Spouse name: Not on file   Number of children: Not on file   Years of education: Not on file   Highest education level: Not on file  Occupational History   Not on file  Tobacco Use   Smoking status: Never   Smokeless tobacco: Never  Substance and Sexual Activity   Alcohol use: No   Drug use: No   Sexual activity: Not on file  Other Topics Concern   Not on file  Social History Narrative   Not on file   Social Determinants of Health   Financial Resource Strain: Not on file  Food Insecurity: Not on file  Transportation Needs: Not on file  Physical Activity: Not on file  Stress: Not on file  Social Connections: Not on file   SDOH:  SDOH Screenings   Alcohol Screen: Not on file  Depression (PHQ2-9): Medium Risk   PHQ-2 Score: 12  Financial Resource Strain: Not on file  Food Insecurity: Not on file  Housing: Not on file  Physical Activity: Not on file  Social Connections: Not on file  Stress: Not on file  Tobacco Use: Low Risk    Smoking Tobacco Use: Never   Smokeless Tobacco Use: Never  Transportation Needs: Not on file    Tobacco Cessation:  A prescription for an FDA-approved tobacco cessation medication was offered at discharge and the patient refused  Current Medications:  Current Facility-Administered Medications  Medication Dose Route Frequency Provider Last Rate Last Admin   acetaminophen (TYLENOL) tablet 650 mg  650 mg Oral Q6H PRN Rankin, Shuvon B, NP   650 mg at 03/09/21 1035   alum & mag hydroxide-simeth (MAALOX/MYLANTA) 200-200-20 MG/5ML suspension 30 mL  30 mL Oral Q4H PRN Rankin, Shuvon B, NP       divalproex (DEPAKOTE ER) 24 hr tablet 500 mg  500 mg Oral BID Lenard Lance, FNP   500 mg at 03/08/21 2037   FLUoxetine (PROZAC) capsule 10 mg  10 mg Oral Daily Rankin, Shuvon B, NP   10 mg at 03/09/21 0909   hydrOXYzine  (ATARAX/VISTARIL) tablet 25 mg  25 mg Oral TID PRN Rankin, Shuvon B, NP   25 mg at 03/09/21 0910   magnesium hydroxide (MILK OF MAGNESIA) suspension 30 mL  30 mL Oral Daily PRN Rankin, Shuvon B, NP       nicotine (NICODERM CQ - dosed in mg/24 hours) patch 14 mg  14 mg Transdermal Daily Rankin, Shuvon B, NP   14 mg at 03/09/21 1001   paliperidone (INVEGA SUSTENNA) injection 234 mg  234 mg Intramuscular Q28 days Lenard Lance, FNP       traZODone (DESYREL) tablet 50 mg  50 mg Oral QHS PRN Rankin, Shuvon B, NP   50 mg at 03/08/21 2110   Current Outpatient Medications  Medication Sig Dispense Refill   FLUoxetine (PROZAC) 10 MG capsule Take 1 capsule (10 mg total) by mouth daily. 30 capsule 3   fluticasone (FLONASE) 50 MCG/ACT nasal spray Place 2 sprays into both nostrils daily. (Patient taking differently: Place 2 sprays into both nostrils daily as needed for allergies.) 16 g 0   ibuprofen (ADVIL) 200 MG tablet Take 800 mg by mouth every 6 (six) hours as needed (For back pain).     divalproex (DEPAKOTE ER) 500 MG 24 hr tablet Take 1 tablet (500 mg total) by mouth 2 (two) times daily. 60 tablet 0   hydrOXYzine (ATARAX/VISTARIL) 25 MG tablet Take 1 tablet (25 mg total) by mouth 3 (three) times daily as needed for anxiety. 30 tablet 0   Lidocaine 4 % PTCH Apply 1 patch topically every 12 (twelve) hours as needed (For back pain). 30 patch 0   paliperidone (INVEGA SUSTENNA) 234 MG/1.5ML SUSY injection Inject 234 mg into the muscle every 28 (twenty-eight) days. 1.8 mL 0   traZODone (DESYREL) 50 MG tablet Take 1 tablet (50 mg total) by mouth at bedtime as needed for sleep. 30 tablet 0    PTA Medications: (Not in a hospital admission)   Musculoskeletal  Strength & Muscle Tone: within normal limits Gait & Station: normal Patient leans: N/A  Psychiatric Specialty Exam  Presentation  General Appearance: Appropriate for Environment; Casual  Eye Contact:Good  Speech:Clear and Coherent; Normal  Rate  Speech Volume:Normal  Handedness:Right   Mood and Affect  Mood:Euthymic  Affect:Appropriate; Congruent   Thought Process  Thought Processes:Coherent; Goal Directed; Linear  Descriptions of Associations:Intact  Orientation:Full (Time, Place and Person)  Thought Content:Logical; WDL  Diagnosis of Schizophrenia or Schizoaffective disorder in past: Yes  Duration of Psychotic Symptoms: Less than six months   Hallucinations:Hallucinations: None  Ideas of Reference:Paranoia  Suicidal Thoughts:Suicidal Thoughts: No  Homicidal Thoughts:Homicidal Thoughts: No   Sensorium  Memory:Immediate Good; Recent Good; Remote Good  Judgment:Fair  Insight:Fair   Executive Functions  Concentration:Good  Attention Span:Good  Recall:Good  Fund of Knowledge:Good  Language:Good   Psychomotor Activity  Psychomotor Activity:Psychomotor Activity: Normal   Assets  Assets:Communication Skills; Desire for Improvement; Financial Resources/Insurance; Housing; Intimacy; Leisure Time; Physical Health; Resilience; Social Support; Talents/Skills; Transportation   Sleep  Sleep:Sleep: Good   No data recorded  Physical Exam  Physical Exam Vitals and nursing note reviewed.  Constitutional:      Appearance: Normal appearance. He is well-developed and normal weight.  HENT:     Head: Normocephalic and atraumatic.     Nose: Nose normal.  Cardiovascular:     Rate and Rhythm: Normal rate.  Pulmonary:     Effort: Pulmonary effort is normal.  Musculoskeletal:        General: Normal range of motion.     Cervical back: Normal range of motion.  Skin:    General: Skin is warm and dry.  Neurological:     Mental Status: He is alert and oriented to person, place, and time.  Psychiatric:        Attention and Perception: Attention and perception normal.        Mood and Affect: Mood and affect normal.        Speech: Speech normal.        Behavior: Behavior normal. Behavior is  cooperative.        Thought Content: Thought content normal.        Cognition and Memory: Cognition and memory normal.        Judgment: Judgment normal.   Review of Systems  Constitutional: Negative.   HENT: Negative.    Eyes: Negative.   Respiratory: Negative.    Cardiovascular: Negative.   Gastrointestinal: Negative.   Genitourinary: Negative.   Musculoskeletal: Negative.   Skin: Negative.   Neurological: Negative.   Endo/Heme/Allergies: Negative.   Psychiatric/Behavioral:  Positive for substance abuse.   Blood pressure 129/80, pulse 94, temperature 98.4 F (36.9 C), temperature source Oral, resp. rate 16, height 6' (1.829 m), weight 191 lb (86.6 kg), SpO2 100 %. Body mass index is 25.9 kg/m.  Demographic Factors:  Male  Loss Factors: NA  Historical Factors: NA  Risk Reduction Factors:   Living with another person, especially a relative, Positive social support, Positive therapeutic relationship, and Positive coping skills or problem solving skills  Continued Clinical Symptoms:  Previous Psychiatric Diagnoses and Treatments  Cognitive Features That Contribute To Risk:  None    Suicide Risk:  Minimal: No identifiable suicidal ideation.  Patients presenting with no risk factors but with morbid ruminations; may be classified as minimal risk based on the severity of the depressive symptoms  Plan Of Care/Follow-up recommendations:  Patient reviewed with Dr Bronwen Betters. Follow-up with outpatient psychiatry at [facility] behavioral health, see appointments provided. Medications: -Invega Sustenna 234 mg IM every 28 days (next dose due 04/06/2021) -Divalproex/Depakote ER 500 mg twice daily -Fluoxetine 10 mg daily -Hydroxyzine 25 mg 3 times daily as needed/anxiety -Ibuprofen 800 mg every 6 hours as needed(back pain) -Lidocaine 4% topical patch every 12 hours as needed (back pain) -Trazodone 50 mg nightly as needed/sleep    Disposition: Discharge  Lenard Lance,  FNP 03/09/2021, 12:40 PM

## 2021-03-09 NOTE — ED Notes (Signed)
Pt ambulatory, alert, and oriented X4 on and off the unit. Education, support, and encouragement provided. Discharge summary/AVS, prescriptions, medications, and follow up appointments reviewed with pt and a copy was given to pt. Suicide prevention resources provided. Pt's belongings in locker returned and belongings sheet signed. Pt denies SI/HI, A/VH, pain, or any concerns at this time. Pt discharged to lobby to his aunt to be transported home.

## 2021-03-09 NOTE — ED Notes (Signed)
Pt reported fall in shower room to nursing staff.  Pt was on floor and alerted staff to him by pulling on call bell.  Initial assessment conducted to determine any serious injury.  Pt reported that he hit his head on the the handrail.  No swelling, discoloration, or open areas noted.  Pupils are round, equal, and reactive to light.  No noted deficite in motor skills.  Full range of motion noted in all extremities. Voiced that he did have pain located to right side of head.  Prn tylenol provided.  Provider and [facility] notified of fall.  Pt called mother to notify her of fall.  Will continue to monitor for safety.

## 2021-03-09 NOTE — Discharge Instructions (Addendum)
[facility]- Outpatient 2nd Floor  New Patient Therapy Walk-ins: Monday-Wednesday: 8am until slots are full.  Please arrive by 7:30 am, patients will be seen in the order of arrival.  Friday: 1pm-5pm Please arrive by 12-12:30pm   New Patient Medication Management Walk-ins: Monday-Friday: 8am-11am.  Please arrive by 7:30am, patients will be seen in the order of arrival.   Please Note: Availability is limited, you may not be seen on the same day that you walk-in. Our goal is to serve and meet the needs of our community to the best of our ability.       Patient is instructed prior to discharge to:  Take all medications as prescribed by his/her mental healthcare provider. Report any adverse effects and or reactions from the medicines to his/her outpatient provider promptly. Keep all scheduled appointments, to ensure that you are getting refills on time and to avoid any interruption in your medication.  If you are unable to keep an appointment call to reschedule.  Be sure to follow-up with resources and follow-up appointments provided.  Patient has been instructed & cautioned: To not engage in alcohol and or illegal drug use while on prescription medicines. In the event of worsening symptoms, patient is instructed to call the crisis hotline, 911 and or go to the nearest ED for appropriate evaluation and treatment of symptoms. To follow-up with his/her primary care provider for your other medical issues, concerns and or health care needs.    

## 2021-03-09 NOTE — ED Notes (Signed)
Pt reports slipping in shower. Pt reports hitting head and c/o back pain. VS: BP 129/80, Pulse 84. O2 sate 99% room air. Pt is ambulatory, alert and oriented X4. NP notified. MHT and LPN with pt.

## 2021-03-09 NOTE — ED Notes (Signed)
Pt given lunch

## 2021-03-09 NOTE — ED Notes (Signed)
Pt alert and standing at nurse station upon arriving this moring.  Noted aggitation when speaking with pt.  Pt demanded that he get his medication but it was before medication hour.  Explained to pt medication schedule time pt left nurses station and went to watch tv.  While watching tv pt took shirt off and attempted to [facility] house with another client.  Staff redirected pt and PRN axiety medication provided.  Will continue to monitor for safety.

## 2021-03-09 NOTE — ED Notes (Signed)
Pt is sleeping in bed. No signs of distress noted. Respirations are even and unlabored. Will continue to monitor for safety. 

## 2021-03-13 ENCOUNTER — Telehealth (HOSPITAL_COMMUNITY): Payer: Self-pay | Admitting: Family Medicine

## 2021-03-13 NOTE — BH Assessment (Addendum)
Care Management - Follow Up [facility] Discharges   Writer made contact with the patient. Patient reports that he has a follow up appointment on 03-23-22 with [facility]

## 2021-03-22 ENCOUNTER — Ambulatory Visit (HOSPITAL_COMMUNITY): Payer: 59

## 2021-03-23 ENCOUNTER — Ambulatory Visit (HOSPITAL_COMMUNITY): Payer: 59 | Admitting: *Deleted

## 2021-03-23 ENCOUNTER — Encounter (HOSPITAL_COMMUNITY): Payer: Self-pay

## 2021-03-23 ENCOUNTER — Telehealth (HOSPITAL_COMMUNITY): Payer: Self-pay | Admitting: *Deleted

## 2021-03-23 ENCOUNTER — Other Ambulatory Visit: Payer: Self-pay

## 2021-03-23 VITALS — BP 126/69 | HR 67 | Ht 72.0 in | Wt 196.0 lb

## 2021-03-23 DIAGNOSIS — F250 Schizoaffective disorder, bipolar type: Secondary | ICD-10-CM

## 2021-03-23 MED ORDER — PALIPERIDONE PALMITATE ER 156 MG/ML IM SUSY
156.0000 mg | PREFILLED_SYRINGE | Freq: Once | INTRAMUSCULAR | Status: AC
  Administered 2021-03-23: 156 mg via INTRAMUSCULAR

## 2021-03-23 NOTE — Telephone Encounter (Signed)
Provider spoke to patient, his mother, and nursing staff. Today patient was pleasant and cooperative. He notes that he feels mentally stable and notes that the has no side effects from past injection. Per chart review  Patient was discharged from [facility] with Invega S 156 on 02/04/2021, initial 234 given on 9/17. On 9/21 he was again discharged form [facility] on oral Invega 6 mg and Depakote 500 mg TID. Patient was seen at [facility] on 9/26 and invega S was restarted. Patient  was scheduled to receive his next Invega S injection on 03/22/2021. Patient presented today (03/23/2021) and received Invega 156. Patient will follow up monthly for further injections. Depakote was reduced at [facility] to 500 mg twice daily. No other concerns noted at this time.

## 2021-03-23 NOTE — Telephone Encounter (Signed)
Consulted with Dr Doyne Keel re the injection he believes he is getting today. He is here with his parents but none of the three are good historians or clear on his injection, only that he is here for a shot. He was recently in [facility] Dr Doyne Keel directed me to give him Invega S 156 mg today. See other notes for this date for more details.

## 2021-03-23 NOTE — Progress Notes (Signed)
In as a referral from downstairs after he had a short stay there. He got an [place] S 234 mg injection on or around the 14 th of Oct. Unclear from the notes what he is here for today, but he and his parents who are with him believe he is getting a shot today. I dont see in his record that he got the loading shot within a week of his 234 mg. I needed to consult Dr Doyne Keel for an order to proceed. She directed me to give him a Invega injection of 156 mg today and going forward. It appears he has insurance, [employer]ia Pouch which is out of [employer] thru his mom. I am unsure how he will be getting his injection or how he will be able to continue at this clinic which is designated to serve uninsured and MCD. Made him and his parents aware of this. I had him complete patient assistance paperwork for his shot but unsure if he will be approved. He was very pleasant, had a nice appearance and is verbal. He has poor insight, asked who he lived with and he said "Her" and pointed to his Mom. Mom said he doesn't live with her, she lives in [place]. Asked again who he lives with and this time Dad answered he lives a week with him and a week with his sister so they all have a break. He states he would like to work, will refer him to [facility] as it takes two months to really get action with [facility]. Told him for now our focus will be on his injection. He also has an appt with a counselor in Dec and mom wishes it were sooner. He states he would like to return to [place] to school, told him I hope he can but it wont be today or this year, again to focus on his mental illness and stability. He is to return for his next injection in one month and will see a provider that day too.  He got his Invega S 156 mg in his R DELTOID without difficulty.

## 2021-03-23 NOTE — Telephone Encounter (Signed)
Provider spoke to patient, his mother, and nursing staff. Today patient was pleasant and cooperative. He notes that he feels mentally stable and notes that the has no side effects from past injection. Per chart review  Patient was discharged from [facility] with Invega S 156 on 02/04/2021, initial 234 given on 9/17. On 9/21 he was again discharged form [facility] on oral Invega 6 mg and Depakote 500 mg TID. Patient was seen at [facility] on 9/26 and invega S was restarted. Patient  was scheduled to receive his next Invega S injection on 03/22/2021. Patient presented today (03/23/2021) and received Invega 156. Patient will follow up monthly for further injections. Depakote was reduced at [facility] to 500 mg twice daily. No other concerns noted at this time.

## 2021-03-27 ENCOUNTER — Telehealth (HOSPITAL_COMMUNITY): Payer: Self-pay | Admitting: *Deleted

## 2021-03-27 NOTE — Telephone Encounter (Signed)
Opened a second time in error. 

## 2021-03-27 NOTE — Telephone Encounter (Signed)
Referral made to [facility] as we discussed. Accepted referral and they will reach out to him with an appt.

## 2021-04-17 ENCOUNTER — Encounter (HOSPITAL_COMMUNITY): Payer: Self-pay

## 2021-04-17 ENCOUNTER — Other Ambulatory Visit: Payer: Self-pay

## 2021-04-17 ENCOUNTER — Ambulatory Visit (HOSPITAL_COMMUNITY): Payer: 59 | Admitting: *Deleted

## 2021-04-17 VITALS — BP 142/74 | HR 90 | Ht 72.0 in | Wt 192.0 lb

## 2021-04-17 DIAGNOSIS — F250 Schizoaffective disorder, bipolar type: Secondary | ICD-10-CM

## 2021-04-17 MED ORDER — PALIPERIDONE PALMITATE ER 156 MG/ML IM SUSY
156.0000 mg | PREFILLED_SYRINGE | INTRAMUSCULAR | Status: DC
  Administered 2021-04-17 – 2021-06-19 (×3): 156 mg via INTRAMUSCULAR

## 2021-04-17 NOTE — Progress Notes (Signed)
Patient ID: John Burton, male   DOB: 2000-12-29, 20 y.o.   MRN: [id number] In accompanied by his dad for his second shot here at this clinic. He states he is doing fine and his dad agrees. His father reports sometimes he gets a little irritable and argumentative but its manageable and usually can be talked down. Today he is pleasant and appropriate. He offers no complaints. Dad said mom wanted him to be changed to every three month shot and we discussed this being a good possibility but first he would have to show compliance, stability and have four sustenna injections in a row first and then it could be changed after speaking with the provider about it. He verbalized his understanding. Injection of Invega S 156 mg given in his L DELTOID without difficulty. He is to return in one month. He also said he was never contacted by [facility], I tried to call while he was here but had to leave a message on VM and I gave him the number so he could follow up himself.

## 2021-04-27 ENCOUNTER — Other Ambulatory Visit (HOSPITAL_COMMUNITY): Payer: Self-pay | Admitting: Psychiatry

## 2021-04-30 ENCOUNTER — Other Ambulatory Visit (HOSPITAL_COMMUNITY): Payer: Self-pay | Admitting: Psychiatry

## 2021-04-30 DIAGNOSIS — F250 Schizoaffective disorder, bipolar type: Secondary | ICD-10-CM

## 2021-04-30 DIAGNOSIS — F411 Generalized anxiety disorder: Secondary | ICD-10-CM

## 2021-05-01 ENCOUNTER — Telehealth (HOSPITAL_COMMUNITY): Payer: Self-pay

## 2021-05-01 NOTE — Telephone Encounter (Signed)
Patient called and stated that patient's mother doesn't want him on the Depakote ER so patient stated that he's stopping it. Patient stated that he's going to stay on the Fluoxetine 10mg  and the Hydroxyzine 25mg . Please review and advise. Thank you

## 2021-05-02 NOTE — Telephone Encounter (Signed)
Provider spoke to patient's father who notes that he is doing well off of the Depakote.  Provider informed father to reach out to the clinic if adjustments need to be made.  Provider informed patient's father that at this point he does not have a scheduled appointment with Clinical research associate.  He endorsed understanding.

## 2021-05-07 ENCOUNTER — Telehealth (HOSPITAL_COMMUNITY): Payer: Self-pay | Admitting: Licensed Clinical Social Worker

## 2021-05-07 NOTE — Telephone Encounter (Signed)
LCSW call pt to confirm appt on 12/13 and father answered who is listed as emergency contact. Appt confirm with father Tammy Sours.

## 2021-05-08 ENCOUNTER — Other Ambulatory Visit: Payer: Self-pay

## 2021-05-08 ENCOUNTER — Ambulatory Visit (INDEPENDENT_AMBULATORY_CARE_PROVIDER_SITE_OTHER): Payer: No Payment, Other | Admitting: Licensed Clinical Social Worker

## 2021-05-08 DIAGNOSIS — F411 Generalized anxiety disorder: Secondary | ICD-10-CM

## 2021-05-08 DIAGNOSIS — F250 Schizoaffective disorder, bipolar type: Secondary | ICD-10-CM | POA: Diagnosis not present

## 2021-05-08 NOTE — Plan of Care (Signed)
Pt agreeable to plan  ?
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
## 2021-05-11 ENCOUNTER — Ambulatory Visit (HOSPITAL_COMMUNITY): Payer: No Payment, Other | Admitting: *Deleted

## 2021-05-11 ENCOUNTER — Other Ambulatory Visit: Payer: Self-pay

## 2021-05-11 ENCOUNTER — Encounter (HOSPITAL_COMMUNITY): Payer: Self-pay

## 2021-05-11 VITALS — BP 154/110 | HR 65 | Ht 72.0 in | Wt 218.0 lb

## 2021-05-11 DIAGNOSIS — F250 Schizoaffective disorder, bipolar type: Secondary | ICD-10-CM

## 2021-05-11 NOTE — Progress Notes (Signed)
In early for his monthly injection of Invega S 156 mg. His mom called this am and asked if he could come in today because he was going to be out of [place] till jan 19th 2023. His shot is scheduled for the 20th of Dec. Consulted with Dr Doyne Keel re giving him a shot today and she approved me going forward with it and mom notified he be here by 4 to get a shot before he leaves on his vacation. Today he got his shot in his R DELTOID without issue. He is in good spirits, nice appearance and offers no complaints. States he is going to [facility] to spend time with his brother and family as his older brother is going into the service. Scheduled him back for his next injection in slightly over a month on 12/24 since he is not back in [place] till the 19th.

## 2021-05-15 ENCOUNTER — Ambulatory Visit (HOSPITAL_COMMUNITY): Payer: No Payment, Other

## 2021-05-15 ENCOUNTER — Ambulatory Visit (HOSPITAL_COMMUNITY): Payer: 59

## 2021-05-23 ENCOUNTER — Telehealth (HOSPITAL_COMMUNITY): Payer: 59 | Admitting: Psychiatry

## 2021-06-19 ENCOUNTER — Ambulatory Visit (INDEPENDENT_AMBULATORY_CARE_PROVIDER_SITE_OTHER): Payer: No Payment, Other | Admitting: Family

## 2021-06-19 ENCOUNTER — Ambulatory Visit (INDEPENDENT_AMBULATORY_CARE_PROVIDER_SITE_OTHER): Payer: No Payment, Other | Admitting: *Deleted

## 2021-06-19 ENCOUNTER — Encounter (HOSPITAL_COMMUNITY): Payer: Self-pay | Admitting: Family

## 2021-06-19 ENCOUNTER — Other Ambulatory Visit: Payer: Self-pay

## 2021-06-19 VITALS — BP 137/82 | HR 88 | Ht 72.0 in | Wt 231.0 lb

## 2021-06-19 DIAGNOSIS — F250 Schizoaffective disorder, bipolar type: Secondary | ICD-10-CM | POA: Diagnosis not present

## 2021-06-19 DIAGNOSIS — F411 Generalized anxiety disorder: Secondary | ICD-10-CM

## 2021-06-19 MED ORDER — PALIPERIDONE PALMITATE ER 156 MG/ML IM SUSY
156.0000 mg | PREFILLED_SYRINGE | INTRAMUSCULAR | Status: DC
Start: 2022-07-20 — End: 2022-02-28
  Administered 2021-07-16: 156 mg via INTRAMUSCULAR

## 2021-06-19 MED ORDER — HYDROXYZINE HCL 10 MG PO TABS
10.0000 mg | ORAL_TABLET | Freq: Three times a day (TID) | ORAL | 2 refills | Status: DC | PRN
Start: 2021-06-19 — End: 2021-09-20

## 2021-06-19 MED ORDER — FLUOXETINE HCL 10 MG PO CAPS: ORAL_CAPSULE | ORAL | 3 refills | Status: DC

## 2021-06-19 NOTE — Progress Notes (Signed)
BH MD/PA/NP OP Progress Note  06/19/2021 11:25 AM [name]  MRN:  [id number]  Chief Complaint:  HPI: John Burton is a 21 year old male seen today for follow-up psychiatric evaluation. Psychiatric history includes schizoaffective disorder, bipolar type and generalized anxiety disorder.  He has been managed historically Paliperidone Kirt Boys 156mg  injection every 28 days.Daily medications include fluoxetine and hydroxyzine, he is compliant with daily medications as well as long-acting injectable. Isrrael feels that his mood is adequately managed.  He is also followed by outpatient counseling.  Nuno would like to discuss having his diagnosis updated.  He states "I do not hear and see things, I wonder if my diagnosis should be updated?"  Discussed diagnoses versus history, patient verbalizes understanding.  Patient agrees with plan to continue to monitor for updated diagnoses.  Patient is assessed face-to-face by nurse practitioner, chart reviewed.    Today patient is seated, no acute distress. He is appropriately groomed. He is alert and oriented, pleasant and cooperative. He has clear and coherent speech average volume.  Behavior calm and appropriate, with good eye contact.  Mood today appears euthymic, congruent affect.    Today provider conducted a PHQ-2, patient scored a 0, at last visit on 05/08/2021 he scored a 2. He denies suicidal and homicidal ideations currently.  He contracts verbally for safety with this Probation officer.      He denies both auditory and visual hallucinations.  There is no indication that he is responding to internal stimuli, no evidence of delusional thought content.  Patient is able to converse coherently with goal-directed thoughts and no distractibility or preoccupation. He denies paranoia.  Objectively there is no evidence of psychosis/mania or delusional thinking.  Patient is insightful regarding treatment and diagnosis.   Patient is  tolerating medications with no adverse effects/reactions per his report.  Patient reports average sleep, 8-9 hours per night. He endorses average appetite.    He denies physical pain currently.  He denies both alcohol and substance use in the last two weeks. One episode of alcohol use, consumed 2 beers, approx one month ago during Christmas celebration.   He shares positive recent outcomes include a discussion with vocational rehabilitation counselor.  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  He reports he became very concerned for his safety during [place] pandemic and elected to voluntarily  eliminate his employment at that time.  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    Patient resides in [place] with his 2 aunts.  He denies access to weapons.  He reports he likes living with his 2 aunts, and passes the time by watching television.  He states he sometimes becomes "exasperated by his anuts silly rules."  Recently he has been directed to refrain from showers lasting longer than 15 minutes.  He reports he is compliant with aunt's directions.   Patient provided support and encouragement.  Patient to receive monthly LAI Invega sustenna. Plan to follow-up in one month for [place] monthly injection.  Patient offered support and encouragement.   Visit Diagnosis: No diagnosis found.  Past Psychiatric History: GAD, schizoaffective disorder, bipolar type  Past Medical History: No past medical history on file.  Past Surgical History:  Procedure Laterality Date   LAPAROSCOPIC APPENDECTOMY N/A 04/18/2014   Procedure: APPENDECTOMY LAPAROSCOPIC;  Surgeon: M.  Gerald Stabs, MD;  Location: [place];  Service: Pediatrics;  Laterality: N/A;    Family Psychiatric History: none reported  Family  History: No family history on file.  Social History:  Social History   Socioeconomic History   Marital status: Single    Spouse name: Not on file   Number of children: Not on file   Years of education: Not on file   Highest education level: Not on file  Occupational History   Not on file  Tobacco Use   Smoking status: Never   Smokeless tobacco: Never  Substance and Sexual Activity   Alcohol use: No   Drug use: No   Sexual activity: Not on file  Other Topics Concern   Not on file  Social History Narrative   Not on file   Social Determinants of Health   Financial Resource Strain: Low Risk    Difficulty of Paying Living Expenses: Not very hard  Food Insecurity: No Food Insecurity   Worried About Running Out of Food in the Last Year: Never true   Ran Out of Food in the Last Year: Never true  Transportation Needs: No Transportation Needs   Lack of Transportation (Medical): No   Lack of Transportation (Non-Medical): No  Physical Activity: Sufficiently Active   Days of Exercise per Week: 5 days   Minutes of Exercise per Session: 120 min  Stress: Stress Concern Present   Feeling of Stress : Very much  Social Connections: Moderately Isolated   Frequency of Communication with Friends and Family: More than three times a week   Frequency of Social Gatherings with Friends and Family: More than three times a week   Attends Religious Services: More than 4 times per year   Active Member of [employer] or Organizations: No   Attends Archivist Meetings: Never   Marital Status: Never married    Allergies:  Allergies  Allergen Reactions   Shellfish Allergy Anaphylaxis and Swelling    Metabolic Disorder Labs: Lab Results  Component Value Date   HGBA1C 5.8 (H) 03/07/2021   MPG 119.76 03/07/2021   No results found for: PROLACTIN Lab Results  Component Value Date   CHOL 136 03/07/2021   TRIG 116 03/07/2021   HDL 41 03/07/2021   CHOLHDL 3.3 03/07/2021   VLDL 23  03/07/2021   LDLCALC 72 03/07/2021   Lab Results  Component Value Date   TSH 2.787 03/07/2021    Therapeutic Level Labs: No results found for: LITHIUM Lab Results  Component Value Date   VALPROATE 119 (H) 03/07/2021   No components found for:  CBMZ  Current Medications: Current Outpatient Medications  Medication Sig Dispense Refill   divalproex (DEPAKOTE ER) 500 MG 24 hr tablet TAKE 1 TABLET(500 MG) BY MOUTH THREE TIMES DAILY 270 tablet 3   FLUoxetine (PROZAC) 10 MG capsule TAKE 1 CAPSULE(10 MG) BY MOUTH DAILY 90 capsule 3   fluticasone (FLONASE) 50 MCG/ACT nasal spray Place 2 sprays into both nostrils daily. (Patient taking differently: Place 2 sprays into both nostrils daily as needed for allergies.) 16 g 0   hydrOXYzine (ATARAX/VISTARIL) 25 MG tablet Take 1 tablet (25 mg total) by mouth 3 (three) times daily as needed for anxiety. 30 tablet 0   ibuprofen (ADVIL) 200 MG tablet Take 800 mg by mouth every 6 (six) hours as needed (For back pain).     Lidocaine 4 % PTCH Apply 1 patch topically every 12 (twelve) hours as needed (For back pain). 30 patch 0  paliperidone (INVEGA SUSTENNA) 234 MG/1.5ML SUSY injection Inject 234 mg into the muscle every 28 (twenty-eight) days. 1.8 mL 0   traZODone (DESYREL) 50 MG tablet Take 1 tablet (50 mg total) by mouth at bedtime as needed for sleep. 30 tablet 0   Current Facility-Administered Medications  Medication Dose Route Frequency Provider Last Rate Last Admin   paliperidone (INVEGA SUSTENNA) injection 156 mg  156 mg Intramuscular Q28 days Eulis Canner E, NP   156 mg at 06/19/21 1108     Musculoskeletal: Strength & Muscle Tone: within normal limits Gait & Station: normal Patient leans: N/A  Psychiatric Specialty Exam: Review of Systems  Constitutional: Negative.   HENT: Negative.    Eyes: Negative.   Respiratory: Negative.    Cardiovascular: Negative.   Gastrointestinal: Negative.   Genitourinary: Negative.   Musculoskeletal:  Negative.   Skin: Negative.   Allergic/Immunologic: Negative.   Neurological: Negative.   Psychiatric/Behavioral: Negative.     There were no vitals taken for this visit.There is no height or weight on file to calculate BMI.  General Appearance: Casual, Neat, and Well Groomed  Eye Contact:  Good  Speech:  Clear and Coherent and Normal Rate  Volume:  Normal  Mood:  Euthymic  Affect:  Appropriate  Thought Process:  Coherent, Goal Directed, and Linear  Orientation:  Full (Time, Place, and Person)  Thought Content: WDL and Logical   Suicidal Thoughts:  No  Homicidal Thoughts:  No  Memory:  Immediate;   Good Recent;   Good  Judgement:  Fair  Insight:  Fair  Psychomotor Activity:  Normal  Concentration:  Concentration: Good and Attention Span: Good  Recall:  Good  Fund of Knowledge: Good  Language: Good  Akathisia:  No  Handed:  Right  AIMS (if indicated): not done  Assets:  Agricultural consultant Housing Intimacy Leisure Time Physical Health Resilience  ADL's:  Intact  Cognition: WNL  Sleep:  Good   Screenings: GAD-7    Health and safety inspector from 05/08/2021 in [facility] Video Visit from 02/19/2021 in [facility]  Total GAD-7 Score 12 21      PHQ2-9    [place] Office Visit from 06/19/2021 in [facility] Counselor from 05/08/2021 in [facility] ED from 03/07/2021 in [facility] Video Visit from 02/19/2021 in [facility]  PHQ-2 Total Score 0 2 5 6   PHQ-9 Total Score -- 15 12 24       [place] Office Visit from 06/19/2021 in [facility] Counselor from 05/08/2021 in [facility] ED from 03/07/2021 in [place] No Risk No Risk Low Risk         Assessment and Plan: Patient reviewed with Dr. Hampton Abbot.  Keandre will return in approximately 28 days for [place] injection.  He will return in approximately 3 months to follow-up with this Probation officer.  Current medications include: -Fluoxetine 10 mg daily -Hydroxyzine 10 mg 3 times daily as needed/anxiety -Paliperidone Kirt Boys 156mg  IM monthly   Lucky Rathke, FNP 06/19/2021, 11:25 AM

## 2021-06-19 NOTE — Progress Notes (Signed)
Patient arrived with his 2 Aunties for today's injection Invega 156 .  He's pleasant as always. Stated he had a very nice Christmas holiday with his family in [place].. Patient tolerated injection well in Left Arm

## 2021-06-20 ENCOUNTER — Ambulatory Visit (INDEPENDENT_AMBULATORY_CARE_PROVIDER_SITE_OTHER): Payer: No Payment, Other | Admitting: Licensed Clinical Social Worker

## 2021-06-20 DIAGNOSIS — F250 Schizoaffective disorder, bipolar type: Secondary | ICD-10-CM | POA: Diagnosis not present

## 2021-06-20 NOTE — Progress Notes (Signed)
° °  THERAPIST PROGRESS NOTE  Virtual Visit via Video Note  I connected with John Burton on 06/20/21 at  2:00 PM EST by a video enabled telemedicine application and verified that I am speaking with the correct person using two identifiers.  Location: Patient: [facility]  Provider: [facility]    I discussed the limitations of evaluation and management by telemedicine and the availability of in person appointments. The patient expressed understanding and agreed to proceed.      I discussed the assessment and treatment plan with the patient. The patient was provided an opportunity to ask questions and all were answered. The patient agreed with the plan and demonstrated an understanding of the instructions.   The patient was advised to call back or seek an in-person evaluation if the symptoms worsen or if the condition fails to improve as anticipated.  I provided 30 minutes of non-face-to-face time during this encounter.   Dory Horn, LCSW   Participation Level: Active  Behavioral Response: CasualAlertAnxious  Type of Therapy: Individual Therapy  Treatment Goals addressed: Anxiety and Coping  Interventions: CBT  Summary: John Burton is a 21 y.o. male who presents with flat mood\affect.  Patient was pleasant, cooperative, and maintained good eye contact.  He engaged well in therapy session and was dressed casually.  Primary stressor for patient is financials and work.  Patient has been following up with vocational rehab through the Department of [employer] [facility] in [place] with no success.  He filled out a form 2 months ago and they stated it would take about 2 months for them to follow-up but patient still has not heard.  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  Due to the inability to connect with vocational rehab employment resources.  Patient reports that he has  been stressed financially and has not been unable to find hobbies outside of the house.  Patient reports his primary goal and objective is to find employment.  Plan: LCSW utilized solution focused therapy for 4 resources to [employer] works and to [employer].  Patient to follow-up with both resources at least 1 time in the next 4 weeks.  Suicidal/Homicidal: Nowithout intent/plan  Therapist Response:    Intervention: LCSW utilized cognitive behavioral therapy, supportive therapy, solution focused therapy and person centered therapy in today's session.  LCSW spoke with patient about signs and symptoms of schizo affect disorder.  Patient disagrees with this diagnosis and feels that he has been misdiagnosed.  Patient reports that his anxiety and depression have gone down.  This is as evidenced by GAD-7 and PHQ-9 both going down 5-6 points.  LCSW utilized Engineering geologist, encouragement, advocacy and praise  Plan: Return again in 4 weeks.      Dory Horn, LCSW 06/20/2021

## 2021-07-11 ENCOUNTER — Ambulatory Visit (INDEPENDENT_AMBULATORY_CARE_PROVIDER_SITE_OTHER): Payer: No Payment, Other | Admitting: Licensed Clinical Social Worker

## 2021-07-11 DIAGNOSIS — F250 Schizoaffective disorder, bipolar type: Secondary | ICD-10-CM | POA: Diagnosis not present

## 2021-07-11 DIAGNOSIS — F411 Generalized anxiety disorder: Secondary | ICD-10-CM

## 2021-07-11 NOTE — Plan of Care (Signed)
Pt is not progressing in any goal this session except for attendance goal. Pt reports barriers as punishment for steal as for leaving the house due to stealing cigarettes. LCSW will continue to monitor PHQ-9 and GAD-7

## 2021-07-11 NOTE — Progress Notes (Signed)
THERAPIST PROGRESS NOTE Virtual Visit via Video Note  I connected with John Burton on 07/11/21 at  2:00 PM EST by a video enabled telemedicine application and verified that I am speaking with the correct person using two identifiers.  Location: Patient: [facility] Provider: [facility]   I discussed the limitations of evaluation and management by telemedicine and the availability of in person appointments. The patient expressed understanding and agreed to proceed.      I discussed the assessment and treatment plan with the patient. The patient was provided an opportunity to ask questions and all were answered. The patient agreed with the plan and demonstrated an understanding of the instructions.   The patient was advised to call back or seek an in-person evaluation if the symptoms worsen or if the condition fails to improve as anticipated.  I provided 45 minutes of non-face-to-face time during this encounter.   Dory Horn, LCSW   Participation Level: Active  Behavioral Response: CasualAlertAnxious and Irritable  Type of Therapy: Individual Therapy  Treatment Goals addressed: Patient will participate in at least 80% of scheduled individual psychotherapy  sessions  ProgressTowards Goals: Not Progressing  Interventions: CBT, Motivational Interviewing, and Supportive  Summary: John Burton is a 21 y.o. male who presents with depressed, anxious, irritable mood\affect.  Patient was cooperative and maintained good eye contact.  He engaged well in therapy session and was dressed casually.  Patient comes in his primary stressor today as family conflict.  Patient reports that he lives with his aunt and states that she takes his phone away.  LCSW inquired why she takes his phone away and patient states because I steal cigarettes.  Patient reports that smoking has been the topic of interest for him as his father does not want him  to smoke cigarettes and his aunts will not allow him to smoke cigarettes.  John Burton reports that he will steal his aunts cigarettes and return.  Patient reports that as punishment his phone will get taken away.  John Burton went on to state that sometimes his phone gets stolen for no reason.  LCSW asked if his aunt was available to talk and he would be agreeable to bring her into session and John Burton was verbally agreeable and handed her the phone.  Aunt reported that he does not get his phone taken for any other reason but stealing.  John Burton reported frustration with this answer and stated that other things have been stolen from him to such as a book, but aunts feel that it was misplaced.  John Burton also reports that he is not allowed out of the house and he reports that this is also part of his punishment.  Suicidal/Homicidal: Nowithout intent/plan  Therapist Response:     Intervention/Plan: LCSW does note that the only goal progressing at this time is patient is participating in at least 80% of his schedule individual psychotherapy.  Patient has not progress in decreasing GAD-7 or PHQ-9 in this session.  LCSW does note that progression was made in the last session.  Patient reports stress and tension due to his recent punishments for stealing cigarettes.  Patient reports that due to his stealing he has not been able to walk outside at least 1 time weekly which is another goal and objective for patient.  LCSW did provide patient with education on "5, 4, 3, 2, 1 technique".  This is to identify 5 things that you can see, for things you can touch, 3 things you  can hear, 2 things you can smell, and 1 thing you can taste.  This is designed as a Scientist, clinical (histocompatibility and immunogenetics) for when patient is feeling anxious and overwhelmed.  Patient was also provided resources for meditation for "Daily calm" which is a free YouTube video.  LCSW also spoke with patient about utilizing techniques for stress balls, baoding balls, and fidgets manners.  All resources  were sent to patient's email and patient to follow-up with those resources.  Patient does report progress and finding employment as vocational rehab has reached out to him and want him to come in for an appointment in the next 2 weeks.  Plan: Return again in 4 weeks.  Diagnosis: Schizoaffective disorder, bipolar type ([place])  Generalized anxiety disorder  Collaboration of Care: Psychiatrist AEB Beatriz Stallion NP   Patient/Guardian was advised Release of Information must be obtained prior to any record release in order to collaborate their care with an outside provider. Patient/Guardian was advised if they have not already done so to contact the registration department to sign all necessary forms in order for [place] to release information regarding their care.   Consent: Patient/Guardian gives verbal consent for treatment and assignment of benefits for services provided during this visit. Patient/Guardian expressed understanding and agreed to proceed.   Dory Horn, LCSW 07/11/2021

## 2021-07-16 ENCOUNTER — Other Ambulatory Visit: Payer: Self-pay

## 2021-07-16 ENCOUNTER — Encounter (HOSPITAL_COMMUNITY): Payer: Self-pay

## 2021-07-16 ENCOUNTER — Ambulatory Visit (INDEPENDENT_AMBULATORY_CARE_PROVIDER_SITE_OTHER): Payer: No Payment, Other | Admitting: *Deleted

## 2021-07-16 VITALS — BP 122/80 | HR 84 | Ht 72.0 in | Wt 232.0 lb

## 2021-07-16 DIAGNOSIS — F250 Schizoaffective disorder, bipolar type: Secondary | ICD-10-CM

## 2021-07-16 NOTE — Progress Notes (Signed)
Patient arrived with his father for paliperidone (INVEGA SUSTENNA) injection 156 mg.  Patient & Father stated that they are going to [place] for a month & I mad sure we had a appointment made before leaving to go out of town. Patient tolerated injection well in Right-Arm

## 2021-07-17 ENCOUNTER — Ambulatory Visit (HOSPITAL_COMMUNITY): Payer: No Payment, Other

## 2021-07-30 ENCOUNTER — Telehealth (HOSPITAL_COMMUNITY): Payer: Self-pay | Admitting: Licensed Clinical Social Worker

## 2021-07-30 ENCOUNTER — Encounter (HOSPITAL_COMMUNITY): Payer: Self-pay

## 2021-07-30 ENCOUNTER — Ambulatory Visit (HOSPITAL_COMMUNITY): Payer: 59 | Admitting: Licensed Clinical Social Worker

## 2021-07-30 NOTE — Telephone Encounter (Signed)
LCSW sent three links to pt phone with no response at 1300, 1305, and 1311. LCSW waited until 1315 before disconnecting  ?

## 2021-08-02 ENCOUNTER — Ambulatory Visit (HOSPITAL_COMMUNITY): Payer: No Payment, Other | Admitting: Licensed Clinical Social Worker

## 2021-08-14 ENCOUNTER — Ambulatory Visit (HOSPITAL_COMMUNITY): Payer: 59

## 2021-08-14 ENCOUNTER — Ambulatory Visit (HOSPITAL_COMMUNITY): Payer: Self-pay

## 2021-08-21 ENCOUNTER — Ambulatory Visit (HOSPITAL_COMMUNITY): Payer: 59

## 2021-08-23 ENCOUNTER — Encounter (HOSPITAL_COMMUNITY): Payer: Self-pay

## 2021-08-23 ENCOUNTER — Ambulatory Visit (INDEPENDENT_AMBULATORY_CARE_PROVIDER_SITE_OTHER): Payer: No Payment, Other | Admitting: *Deleted

## 2021-08-23 VITALS — BP 129/70 | HR 131 | Ht 72.0 in | Wt 232.0 lb

## 2021-08-23 DIAGNOSIS — F250 Schizoaffective disorder, bipolar type: Secondary | ICD-10-CM | POA: Diagnosis not present

## 2021-08-23 MED ORDER — PALIPERIDONE PALMITATE ER 156 MG/ML IM SUSY
156.0000 mg | PREFILLED_SYRINGE | INTRAMUSCULAR | Status: DC
  Administered 2021-08-23 – 2021-09-20 (×2): 156 mg via INTRAMUSCULAR

## 2021-08-23 NOTE — Progress Notes (Signed)
In late for his shot, late by 10 days and 2 hours late today but did get her before 4 pm which is the cut off time for shot clinic. He is subdued but denies any problems. States he has been in [place] for the past month reason he is late for his shot. Discussed him having to improve his compliance to be considered for the every three month Invega. Today his shot of Invega S 156 mg was given in his L DELTOID without issue. No complaints voiced. To return in one month and also see a provider next visit.  ?

## 2021-08-30 ENCOUNTER — Ambulatory Visit (HOSPITAL_COMMUNITY): Payer: 59 | Admitting: Licensed Clinical Social Worker

## 2021-09-11 ENCOUNTER — Ambulatory Visit (HOSPITAL_COMMUNITY): Payer: 59

## 2021-09-11 ENCOUNTER — Ambulatory Visit (HOSPITAL_COMMUNITY): Payer: Self-pay

## 2021-09-17 ENCOUNTER — Other Ambulatory Visit (HOSPITAL_COMMUNITY): Payer: Self-pay | Admitting: Psychiatry

## 2021-09-17 DIAGNOSIS — F250 Schizoaffective disorder, bipolar type: Secondary | ICD-10-CM

## 2021-09-17 DIAGNOSIS — F411 Generalized anxiety disorder: Secondary | ICD-10-CM

## 2021-09-18 ENCOUNTER — Ambulatory Visit (HOSPITAL_COMMUNITY): Payer: No Payment, Other

## 2021-09-20 ENCOUNTER — Ambulatory Visit (INDEPENDENT_AMBULATORY_CARE_PROVIDER_SITE_OTHER): Payer: No Payment, Other | Admitting: Registered Nurse

## 2021-09-20 ENCOUNTER — Encounter (HOSPITAL_COMMUNITY): Payer: Self-pay | Admitting: Registered Nurse

## 2021-09-20 ENCOUNTER — Ambulatory Visit (INDEPENDENT_AMBULATORY_CARE_PROVIDER_SITE_OTHER): Payer: No Payment, Other | Admitting: *Deleted

## 2021-09-20 ENCOUNTER — Encounter (HOSPITAL_COMMUNITY): Payer: Self-pay

## 2021-09-20 VITALS — BP 119/71 | HR 92 | Ht 72.0 in | Wt 213.0 lb

## 2021-09-20 DIAGNOSIS — F411 Generalized anxiety disorder: Secondary | ICD-10-CM | POA: Diagnosis not present

## 2021-09-20 DIAGNOSIS — F250 Schizoaffective disorder, bipolar type: Secondary | ICD-10-CM | POA: Diagnosis not present

## 2021-09-20 MED ORDER — DIVALPROEX SODIUM ER 500 MG PO TB24: ORAL_TABLET | ORAL | 2 refills | Status: DC

## 2021-09-20 MED ORDER — PALIPERIDONE PALMITATE ER 546 MG/1.75ML IM SUSY
546.0000 mg | PREFILLED_SYRINGE | Freq: Once | INTRAMUSCULAR | Status: AC
Start: 2021-09-20 — End: 2021-10-18
  Administered 2021-10-18: 561.6 mg via INTRAMUSCULAR

## 2021-09-20 MED ORDER — FLUOXETINE HCL 10 MG PO CAPS: ORAL_CAPSULE | ORAL | 3 refills | Status: DC

## 2021-09-20 MED ORDER — HYDROXYZINE HCL 10 MG PO TABS: 10.0000 mg | ORAL_TABLET | Freq: Three times a day (TID) | ORAL | 2 refills | Status: DC | PRN

## 2021-09-20 NOTE — Progress Notes (Signed)
BH MD/PA/NP OP Progress Note ? ?09/20/2021 2:57 PM ?John Burton  ?MRN:  [id number] ? ?Chief Complaint:  ?Chief Complaint  ?Patient presents with  ? Follow up long acting injectable  ? ?HPI:  John Burton is a 89 yr. old male seen today for follow-up psychiatric evaluation and long-acting injection Gean Birchwood(Invega Sustenna). Psychiatric history includes schizoaffective disorder, bipolar type and generalized anxiety disorder.  His mental health is managed with Paliperidone Gean BirchwoodInvega Sust[place], fluoxetine and hydroxyzine.  He is reporting that medications are working well and managing mental health with no adverse reactions.  He denies anxiety, depression, suicidal/self-harm/homicidal ideation, psychosis, and paranoia.  He reports that his mood has been stable with no fluctuations.  He reports that he is eating and sleeping without difficulty.  AIMS, GAD-7, PHQ-9, and CSRRS conducted by provider see scores below.  He is wanting to switch to [place].  Informed he has to do well on [place] for 4 months before can switch to [place].  If no problems prior to next visit can start the [place] at next visit.  Understanding voiced.    ? ?Visit Diagnosis:  ?  ICD-10-CM   ?1. Schizoaffective disorder, bipolar type (HCC)  F25.0 divalproex (DEPAKOTE ER) 500 MG 24 hr tablet  ?  Valproic Acid level  ?  CBC with Differential  ?  TSH  ?  Lipid Profile  ?  HgB A1c  ?  Drug Screen, Urine  ?  COMPLETE METABOLIC PANEL WITH GFR  ?  Prolactin  ?  Paliperidone Palmitate ER SUSY 561.6 mg  ?  ?2. Generalized anxiety disorder  F41.1 FLUoxetine (PROZAC) 10 MG capsule  ?  hydrOXYzine (ATARAX) 10 MG tablet  ?  ? ? ?Past Psychiatric History: GAD, schizoaffective disorder, bipolar type ? ?Past Medical History: History reviewed. No pertinent past medical history.  ?Past Surgical History:  ?Procedure Laterality Date  ? LAPAROSCOPIC APPENDECTOMY N/A 04/18/2014  ? Procedure: APPENDECTOMY LAPAROSCOPIC;  Surgeon: Judie PetitM. Leonia CoronaShuaib Farooqui, MD;   Location: [facility] OR;  Service: Pediatrics;  Laterality: N/A;  ? ? ?Family Psychiatric History: None reported ? ?Family History: History reviewed. No pertinent family history. ? ?Social History:  ?Social History  ? ?Socioeconomic History  ? Marital status: Single  ?  Spouse name: Not on file  ? Number of children: Not on file  ? Years of education: Not on file  ? Highest education level: Not on file  ?Occupational History  ? Not on file  ?Tobacco Use  ? Smoking status: Never  ? Smokeless tobacco: Never  ?Substance and Sexual Activity  ? Alcohol use: No  ? Drug use: No  ? Sexual activity: Not on file  ?Other Topics Concern  ? Not on file  ?Social History Narrative  ? Not on file  ? ?Social Determinants of Health  ? ?Financial Resource Strain: Low Risk   ? Difficulty of Paying Living Expenses: Not very hard  ?Food Insecurity: No Food Insecurity  ? Worried About Programme researcher, broadcasting/film/videounning Out of Food in the Last Year: Never true  ? Ran Out of Food in the Last Year: Never true  ?Transportation Needs: No Transportation Needs  ? Lack of Transportation (Medical): No  ? Lack of Transportation (Non-Medical): No  ?Physical Activity: Sufficiently Active  ? Days of Exercise per Week: 5 days  ? Minutes of Exercise per Session: 120 min  ?Stress: Stress Concern Present  ? Feeling of Stress : Very much  ?Social Connections: Moderately Isolated  ? Frequency of Communication with Friends and Family:  More than three times a week  ? Frequency of Social Gatherings with Friends and Family: More than three times a week  ? Attends Religious Services: More than 4 times per year  ? Active Member of Clubs or Organizations: No  ? Attends Banker Meetings: Never  ? Marital Status: Never married  ? ? ?Allergies:  ?Allergies  ?Allergen Reactions  ? Shellfish Allergy Anaphylaxis and Swelling  ? ? ?Metabolic Disorder Labs: ?Lab Results  ?Component Value Date  ? HGBA1C 5.8 (H) 03/07/2021  ? MPG 119.76 03/07/2021  ? ?No results found for: PROLACTIN ?Lab Results   ?Component Value Date  ? CHOL 136 03/07/2021  ? TRIG 116 03/07/2021  ? HDL 41 03/07/2021  ? CHOLHDL 3.3 03/07/2021  ? VLDL 23 03/07/2021  ? LDLCALC 72 03/07/2021  ? ?Lab Results  ?Component Value Date  ? TSH 2.787 03/07/2021  ? ? ?Therapeutic Level Labs: ?No results found for: LITHIUM ?Lab Results  ?Component Value Date  ? VALPROATE 119 (H) 03/07/2021  ? ?No components found for:  CBMZ ? ?Current Medications: ?Current Outpatient Medications  ?Medication Sig Dispense Refill  ? divalproex (DEPAKOTE ER) 500 MG 24 hr tablet TAKE 1 TABLET(500 MG) BY MOUTH THREE TIMES DAILY 90 tablet 2  ? FLUoxetine (PROZAC) 10 MG capsule TAKE 1 CAPSULE(10 MG) BY MOUTH DAILY 30 capsule 3  ? fluticasone (FLONASE) 50 MCG/ACT nasal spray Place 2 sprays into both nostrils daily. (Patient taking differently: Place 2 sprays into both nostrils daily as needed for allergies.) 16 g 0  ? hydrOXYzine (ATARAX) 10 MG tablet Take 1 tablet (10 mg total) by mouth 3 (three) times daily as needed. 30 tablet 2  ? ibuprofen (ADVIL) 200 MG tablet Take 800 mg by mouth every 6 (six) hours as needed (For back pain).    ? Lidocaine 4 % PTCH Apply 1 patch topically every 12 (twelve) hours as needed (For back pain). 30 patch 0  ? ?Current Facility-Administered Medications  ?Medication Dose Route Frequency Provider Last Rate Last Admin  ? [START ON 07/20/2022] paliperidone (INVEGA SUSTENNA) injection 156 mg  156 mg Intramuscular Q30 days Lenard Lance, FNP   156 mg at 07/16/21 1040  ? paliperidone (INVEGA SUSTENNA) injection 156 mg  156 mg Intramuscular Q28 days Ethanjames Fontenot B, NP   156 mg at 08/23/21 1605  ? Paliperidone Palmitate ER SUSY 561.6 mg  561.6 mg Intramuscular Once Dewitt Judice B, NP      ? ? ? ?Musculoskeletal: ?Strength & Muscle Tone: within normal limits ?Gait & Station: normal ?Patient leans: Right ? ?Psychiatric Specialty Exam: ?Review of Systems  ?Constitutional: Negative.   ?HENT: Negative.    ?Eyes: Negative.   ?Respiratory: Negative.     ?Cardiovascular: Negative.   ?Gastrointestinal: Negative.   ?Genitourinary: Negative.   ?Musculoskeletal: Negative.   ?Skin: Negative.   ?Neurological: Negative.   ?Hematological: Negative.   ?Psychiatric/Behavioral:  Negative for agitation, decreased concentration, dysphoric mood, hallucinations, self-injury, sleep disturbance and suicidal ideas. The patient is not nervous/anxious.    ?There were no vitals taken for this visit.There is no height or weight on file to calculate BMI.  ?General Appearance: Casual  ?Eye Contact:  Good  ?Speech:  Clear and Coherent and Normal Rate  ?Volume:  Normal  ?Mood:  Euthymic  ?Affect:  Appropriate and Congruent  ?Thought Process:  Coherent, Goal Directed, and Descriptions of Associations: Intact  ?Orientation:  Full (Time, Place, and Person)  ?Thought Content: WDL and Logical   ?Suicidal  Thoughts:  No  ?Homicidal Thoughts:  No  ?Memory:  Immediate;   Good ?Recent;   Good ?Remote;   Good  ?Judgement:  Intact  ?Insight:  Present  ?Psychomotor Activity:  Normal  ?Concentration:  Concentration: Good and Attention Span: Good  ?Recall:  Good  ?Fund of Knowledge: Good  ?Language: Good  ?Akathisia:  No  ?Handed:  Right  ?AIMS (if indicated): done  ?Assets:  Communication Skills ?Desire for Improvement ?Financial Resources/Insurance ?Housing ?Physical Health ?Resilience ?Social Support  ?ADL's:  Intact  ?Cognition: WNL  ?Sleep:  Good  ? ?Screenings: ?GAD-7   ? ?Flowsheet Row Counselor from 07/11/2021 in [facility] Counselor from 06/20/2021 in [facility] Counselor from 05/08/2021 in [facility] Video Visit from 02/19/2021 in [facility]  ?Total GAD-7 Score 13 7 12 21   ? ?  ? ?PHQ2-9   ? ?Flowsheet Row Counselor from 07/11/2021 in [facility] Counselor from 06/20/2021 in [facility] Office Visit from 06/19/2021 in [facility] Counselor from 05/08/2021 in [facility] ED from 03/07/2021 in [facility]  ?PHQ-2 Total Score 4 4 0 2 5  ?PHQ-9 To

## 2021-09-20 NOTE — Progress Notes (Signed)
In as scheduled for his monthly injection of Invega S 156 mg given today in his R DELTOID. He states he is doing well, no complaints. He states today he is expecting to get his [place] shot. He spoke with [facility] NP and she ordered [place] going forward but today got a [place] shot 156 mg because we dont have a Trin[place] shot for him. He is patient assistance and completed paperwork today and will hope to have in next month when he returns.  ?

## 2021-10-13 LAB — CBC WITH DIFFERENTIAL/PLATELET
Basophils Absolute: 0 10*3/uL (ref 0.0–0.2)
Basos: 0 %
EOS (ABSOLUTE): 0.1 10*3/uL (ref 0.0–0.4)
Eos: 2 %
Hematocrit: 44.6 % (ref 37.5–51.0)
Hemoglobin: 14.8 g/dL (ref 13.0–17.7)
Immature Grans (Abs): 0 10*3/uL (ref 0.0–0.1)
Immature Granulocytes: 0 %
Lymphocytes Absolute: 1.7 10*3/uL (ref 0.7–3.1)
Lymphs: 30 %
MCH: 29.7 pg (ref 26.6–33.0)
MCHC: 33.2 g/dL (ref 31.5–35.7)
MCV: 90 fL (ref 79–97)
Monocytes Absolute: 0.5 10*3/uL (ref 0.1–0.9)
Monocytes: 9 %
Neutrophils Absolute: 3.3 10*3/uL (ref 1.4–7.0)
Neutrophils: 59 %
Platelets: 221 10*3/uL (ref 150–450)
RBC: 4.98 x10E6/uL (ref 4.14–5.80)
RDW: 14.3 % (ref 11.6–15.4)
WBC: 5.6 10*3/uL (ref 3.4–10.8)

## 2021-10-13 LAB — COMPREHENSIVE METABOLIC PANEL
ALT: 12 IU/L (ref 0–44)
AST: 15 IU/L (ref 0–40)
Albumin/Globulin Ratio: 1.9 (ref 1.2–2.2)
Albumin: 5 g/dL (ref 4.1–5.2)
Alkaline Phosphatase: 120 IU/L (ref 44–121)
BUN/Creatinine Ratio: 10 (ref 9–20)
BUN: 10 mg/dL (ref 6–20)
Bilirubin Total: 0.6 mg/dL (ref 0.0–1.2)
CO2: 24 mmol/L (ref 20–29)
Calcium: 9.5 mg/dL (ref 8.7–10.2)
Chloride: 102 mmol/L (ref 96–106)
Creatinine, Ser: 0.96 mg/dL (ref 0.76–1.27)
Globulin, Total: 2.6 g/dL (ref 1.5–4.5)
Glucose: 97 mg/dL (ref 70–99)
Potassium: 4 mmol/L (ref 3.5–5.2)
Sodium: 139 mmol/L (ref 134–144)
Total Protein: 7.6 g/dL (ref 6.0–8.5)
eGFR: 115 mL/min/{1.73_m2} (ref >59)

## 2021-10-13 LAB — DRUG SCREEN, URINE
Amphetamines, Urine: NEGATIVE ng/mL
Barbiturate screen, urine: NEGATIVE ng/mL
Benzodiazepine Quant, Ur: NEGATIVE ng/mL
Cannabinoid Quant, Ur: POSITIVE ng/mL — AB
Cocaine (Metab.): NEGATIVE ng/mL
Opiate Quant, Ur: NEGATIVE ng/mL
PCP Quant, Ur: NEGATIVE ng/mL

## 2021-10-13 LAB — LIPID PANEL
Chol/HDL Ratio: 3.9 ratio (ref 0.0–5.0)
Cholesterol, Total: 148 mg/dL (ref 100–199)
HDL: 38 mg/dL — ABNORMAL LOW (ref >39)
LDL Chol Calc (NIH): 89 mg/dL (ref 0–99)
Triglycerides: 115 mg/dL (ref 0–149)
VLDL Cholesterol Cal: 21 mg/dL (ref 5–40)

## 2021-10-13 LAB — HEMOGLOBIN A1C
Est. average glucose Bld gHb Est-mCnc: 120 mg/dL
Hgb A1c MFr Bld: 5.8 % — ABNORMAL HIGH (ref 4.8–5.6)

## 2021-10-13 LAB — PROLACTIN: Prolactin: 56.2 ng/mL — ABNORMAL HIGH (ref 4.0–15.2)

## 2021-10-13 LAB — TSH: TSH: 0.872 u[IU]/mL (ref 0.450–4.500)

## 2021-10-13 LAB — VALPROIC ACID LEVEL: Valproic Acid Lvl: 46 ug/mL — ABNORMAL LOW (ref 50–100)

## 2021-10-18 ENCOUNTER — Encounter (HOSPITAL_COMMUNITY): Payer: Self-pay

## 2021-10-18 ENCOUNTER — Ambulatory Visit (INDEPENDENT_AMBULATORY_CARE_PROVIDER_SITE_OTHER): Payer: No Payment, Other | Admitting: *Deleted

## 2021-10-18 VITALS — BP 124/80 | HR 94 | Ht 72.0 in | Wt 201.0 lb

## 2021-10-18 DIAGNOSIS — F250 Schizoaffective disorder, bipolar type: Secondary | ICD-10-CM

## 2021-10-18 NOTE — Progress Notes (Signed)
In for his first three month injection. Today he got Invega Trin[place] 546mg  in his R DELTOID. He is excited about a three month shot and appt. He is the one that remembered today he was getting his first 3 months shot. He is going with his family to [facility] next week on vacay. He offers no complaints. He has a good personal appearance and is pleasant and appropriate. He is to return in 3 months for his next shot.

## 2021-11-04 ENCOUNTER — Other Ambulatory Visit (HOSPITAL_COMMUNITY): Payer: Self-pay | Admitting: Psychiatry

## 2021-11-04 DIAGNOSIS — F411 Generalized anxiety disorder: Secondary | ICD-10-CM

## 2021-11-04 DIAGNOSIS — F250 Schizoaffective disorder, bipolar type: Secondary | ICD-10-CM

## 2022-01-16 ENCOUNTER — Telehealth (HOSPITAL_COMMUNITY): Payer: Self-pay | Admitting: *Deleted

## 2022-01-16 NOTE — Telephone Encounter (Signed)
Tried to call him to change his appt off Monday 8/28 as we dont give shots on that day. He states he is not in town on the Friday before and is leaving Tues the 29 th for [facility] for his brothers graduation. Will have to leave his appt as is to accomadate his schedule.

## 2022-01-17 ENCOUNTER — Ambulatory Visit (HOSPITAL_COMMUNITY): Payer: 59

## 2022-01-21 ENCOUNTER — Ambulatory Visit (INDEPENDENT_AMBULATORY_CARE_PROVIDER_SITE_OTHER): Payer: 59 | Admitting: Psychiatry

## 2022-01-21 ENCOUNTER — Encounter (HOSPITAL_COMMUNITY): Payer: Self-pay

## 2022-01-21 ENCOUNTER — Encounter (HOSPITAL_COMMUNITY): Payer: Self-pay | Admitting: Psychiatry

## 2022-01-21 ENCOUNTER — Ambulatory Visit (INDEPENDENT_AMBULATORY_CARE_PROVIDER_SITE_OTHER): Payer: 59 | Admitting: *Deleted

## 2022-01-21 VITALS — BP 122/80 | HR 86 | Ht 72.0 in | Wt 228.0 lb

## 2022-01-21 DIAGNOSIS — F250 Schizoaffective disorder, bipolar type: Secondary | ICD-10-CM

## 2022-01-21 MED ORDER — INVEGA SUSTENNA 156 MG/ML IM SUSY: 156.0000 mg | PREFILLED_SYRINGE | INTRAMUSCULAR | 11 refills | Status: DC

## 2022-01-21 NOTE — Progress Notes (Signed)
Patient arrived  with Dad today for injection today -Patient stated Everything is going Good. Tolerated Paliperidone Palmitate ER SUSY 561.6 mg   [[id number]] Ordered Dose: 546 mg   Injection Well in Left Arm-- Pleasant as Always -- EXCITED Heading to [place] for his [employer]

## 2022-01-21 NOTE — Progress Notes (Signed)
[place] MD/PA/NP OP Progress Note Virtual Visit via Telephone Note  I connected with John Burton on 01/21/22 at 11:00 AM EDT by telephone and verified that I am speaking with the correct person using two identifiers.  Location: Patient: home Provider: Clinic   I discussed the limitations, risks, security and privacy concerns of performing an evaluation and management service by telephone and the availability of in person appointments. I also discussed with the patient that there may be a patient responsible charge related to this service. The patient expressed understanding and agreed to proceed.   I provided 30 minutes of non-face-to-face time during this encounter.  01/21/2022 9:24 AM John Burton  MRN:  [id number]  Chief Complaint: " I am doing okay" Per father "He is childlike and needs assistance with everything"  HPI:  21 year old male seen today for follow-up psychiatric evaluation. 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 He is currently managed on Invega 156 mg, monthly divalproex ER Depakote 500 mg three tablets twice daily, Prozac 10 mg daily, and hydroxyzine 10 mg 3 times daily as needed.Marland Kitchen He notes that he is only taking his Invega injections as she notes his other medications were ineffective.     Today he was unable to logon virtually so assessment done over the phone.  During exam he was pleasant, cooperative, and engaged in conversation.  He informed Probation officer that he is doing okay.  He notes that he found Prozac, hydroxyzine, and Depakote ineffective but notes that [place] manages his mental health conditions well.  Patient was seen with his father who notes that he is childlike and needs assistance with everything.  He reports that when he was on Depakote, Prozac, hydroxyzine he was more lucid and able to do things on his own.  At times patient father  notes that he spaces out and is unable to focus.  Patient however disagreed and notes that he does not want to restart these medications.  He did note that his anxiety and depression are well managed.  Provider conducted a GAD-7 and patient scored a 6.  Provider also conducted PHQ-9 and patient scored a 6.  He endorses adequate sleep and fluctuations in his appetite.  He notes that he has gained 10 pounds since his last visit.  Today he denies SI/HI/VAH, mania, paranoia.   At this time patient agreeable to only continuing Invega.  He will follow-up in a month with nursing staff for his next injection.  No other concerns at this time.    Visit Diagnosis:    ICD-10-CM   1. 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  Past Medical History: No past medical history on file.  Past Surgical History:  Procedure Laterality Date   LAPAROSCOPIC APPENDECTOMY N/A 04/18/2014   Procedure: APPENDECTOMY LAPAROSCOPIC;  Surgeon: Jerilynn Mages. Gerald Stabs, MD;  Location: [facility];  Service: Pediatrics;  Laterality: N/A;    Family Psychiatric History: Denies  Family History: No family history on file.  Social History:  Social History   Socioeconomic History   Marital status: Single    Spouse name: Not on file   Number of children: Not on file   Years of education: Not on file   Highest  education level: Not on file  Occupational History   Not on file  Tobacco Use   Smoking status: Never   Smokeless tobacco: Never  Substance and Sexual Activity   Alcohol use: No   Drug use: No   Sexual activity: Not on file  Other Topics Concern   Not on file  Social History Narrative   Not on file   Social Determinants of Health   Financial Resource Strain: Low Risk  (05/08/2021)   Overall  Financial Resource Strain (CARDIA)    Difficulty of Paying Living Expenses: Not very hard  Food Insecurity: No Food Insecurity (05/08/2021)   Hunger Vital Sign    Worried About Running Out of Food in the Last Year: Never true    Ran Out of Food in the Last Year: Never true  Transportation Needs: No Transportation Needs (05/08/2021)   PRAPARE - Administrator, Civil Service (Medical): No    Lack of Transportation (Non-Medical): No  Physical Activity: Sufficiently Active (05/08/2021)   Exercise Vital Sign    Days of Exercise per Week: 5 days    Minutes of Exercise per Session: 120 min  Stress: Stress Concern Present (05/08/2021)   [employer] of Occupational Health - Occupational Stress Questionnaire    Feeling of Stress : Very much  Social Connections: Moderately Isolated (05/08/2021)   Social Connection and Isolation Panel [NHANES]    Frequency of Communication with Friends and Family: More than three times a week    Frequency of Social Gatherings with Friends and Family: More than three times a week    Attends Religious Services: More than 4 times per year    Active Member of [employer] or Organizations: No    Attends Banker Meetings: Never    Marital Status: Never married    Allergies:  Allergies  Allergen Reactions   Shellfish Allergy Anaphylaxis and Swelling    Metabolic Disorder Labs: Lab Results  Component Value Date   HGBA1C 5.8 (H) 10/12/2021   MPG 119.76 03/07/2021   Lab Results  Component Value Date   PROLACTIN 56.2 (H) 10/12/2021   Lab Results  Component Value Date   CHOL 148 10/12/2021   TRIG 115 10/12/2021   HDL 38 (L) 10/12/2021   CHOLHDL 3.9 10/12/2021   VLDL 23 03/07/2021   LDLCALC 89 10/12/2021   LDLCALC 72 03/07/2021   Lab Results  Component Value Date   TSH 0.872 10/12/2021   TSH 2.787 03/07/2021    Therapeutic Level Labs: No results found for: "LITHIUM" Lab Results  Component Value Date   VALPROATE 46 (L)  10/12/2021   VALPROATE 119 (H) 03/07/2021   No results found for: "CBMZ"  Current Medications: Current Outpatient Medications  Medication Sig Dispense Refill   paliperidone (INVEGA SUSTENNA) 156 MG/ML SUSY injection Inject 1 mL (156 mg total) into the muscle every 30 (thirty) days. 1 mL 11   fluticasone (FLONASE) 50 MCG/ACT nasal spray Place 2 sprays into both nostrils daily. (Patient taking differently: Place 2 sprays into both nostrils daily as needed for allergies.) 16 g 0   ibuprofen (ADVIL) 200 MG tablet Take 800 mg by mouth every 6 (six) hours as needed (For back pain).     Lidocaine 4 % PTCH Apply 1 patch topically every 12 (twelve) hours as needed (For back pain). 30 patch 0   Current Facility-Administered Medications  Medication Dose Route Frequency Provider Last Rate Last Admin   [START ON 07/20/2022] paliperidone (INVEGA SUSTENNA) injection 156  mg  156 mg Intramuscular Q30 days Lucky Rathke, FNP   156 mg at 07/16/21 1040   paliperidone (INVEGA SUSTENNA) injection 156 mg  156 mg Intramuscular Q28 days Rankin, Shuvon B, NP   156 mg at 09/20/21 1611     Musculoskeletal: Strength & Muscle Tone:  Unable to assess due to telephone visit Gait & Station:  Unable to assess due to telephone visit Patient leans: N/A  Psychiatric Specialty Exam: Review of Systems  There were no vitals taken for this visit.There is no height or weight on file to calculate BMI.  General Appearance:  Unable to assess due to telephone visit  Eye Contact:   Unable to assess due to telephone visit  Speech:  Clear and Coherent and Normal Rate  Volume:  Normal  Mood:  Euthymic  Affect:  Appropriate and Congruent  Thought Process:  Coherent, Irrelevant, and NA  Orientation:  Full (Time, Place, and Person)  Thought Content: WDL and Logical   Suicidal Thoughts:  No  Homicidal Thoughts:  No  Memory:  Immediate;   Good Recent;   Good Remote;   Good  Judgement:  Good  Insight:  Good  Psychomotor  Activity:   Unable to assess due to telephone visit  Concentration:  Concentration: Good and Attention Span: Good  Recall:  Good  Fund of Knowledge: Good  Language: Good  Akathisia:   Unable to assess due to telephone visit  Handed:  Right  AIMS (if indicated): not done  Assets:  Communication Skills Desire for Improvement Financial Resources/Insurance Housing Leisure Time Physical Health Social Support  ADL's:  Intact  Cognition: WNL  Sleep:  Good   Screenings: GAD-7    Physiological scientist Office Visit from 01/21/2022 in [facility] Counselor from 07/11/2021 in [facility] Counselor from 06/20/2021 in [facility] Counselor from 05/08/2021 in [facility] Video Visit from 02/19/2021 in [facility]  Total GAD-7 Score 6 13 7 12 21       PHQ2-9    [place] Office Visit from 01/21/2022 in [facility] Counselor from 07/11/2021 in [facility] Counselor from 06/20/2021 in [facility] Office Visit from 06/19/2021 in [facility] Counselor from 05/08/2021 in [facility]  PHQ-2 Total Score 2 4 4  0 2  PHQ-9 Total Score 6 16 9  -- [place] Office Visit from 06/19/2021 in [facility] Counselor from 05/08/2021 in [facility] ED from 03/07/2021 in [place] No Risk No Risk Low Risk        Assessment and Plan: Patient reports that he does not want to restart Depakote, Prozac, or hydroxyzine.  He is agreeable to continuing Abilify as prescribed.  1. Schizoaffective disorder, bipolar type ([place])  Continue- paliperidone (INVEGA SUSTENNA) 156 MG/ML SUSY injection; Inject 1 mL (156 mg total) into the  muscle every 30 (thirty) days.  Dispense: 1 mL; Refill: 11   Collaboration of Care: Collaboration of Care: Other provider involved in patient's care AEB counselor and nursing staff  Patient/Guardian was advised Release of Information must be obtained prior to any record release in order to collaborate their care with an outside provider. Patient/Guardian was advised if they have not already done so to contact the registration department to sign  all necessary forms in order for [place] to release information regarding their care.   Consent: Patient/Guardian gives verbal consent for treatment and assignment of benefits for services provided during this visit. Patient/Guardian expressed understanding and agreed to proceed.   Follow-up in 3 months Follow-up with therapy Shanna Cisco, NP 01/21/2022, 9:24 AM

## 2022-02-28 ENCOUNTER — Other Ambulatory Visit (HOSPITAL_COMMUNITY): Payer: Self-pay | Admitting: Registered Nurse

## 2022-02-28 DIAGNOSIS — F250 Schizoaffective disorder, bipolar type: Secondary | ICD-10-CM

## 2022-02-28 MED ORDER — INVEGA TRINZA 546 MG/1.75ML IM SUSY: 546.0000 mg | PREFILLED_SYRINGE | INTRAMUSCULAR | 2 refills | Status: DC

## 2022-02-28 MED ORDER — PALIPERIDONE PALMITATE ER 546 MG/1.75ML IM SUSY
546.0000 mg | PREFILLED_SYRINGE | Freq: Once | INTRAMUSCULAR | Status: AC
Start: 2022-02-28 — End: 2022-04-16
  Administered 2022-04-16: 546 mg via INTRAMUSCULAR

## 2022-02-28 NOTE — Telephone Encounter (Cosign Needed)
Elevated Prolactin level 10/12/2021.  Will have lab repeated since starting the Invega Trinza 546 mg.    Component Ref Range & Units 4 mo ago  Prolactin 4.0 - 15.2 ng/mL 56.2 High      Gentry Pilson B. Mattie Novosel, NP

## 2022-04-15 ENCOUNTER — Telehealth (INDEPENDENT_AMBULATORY_CARE_PROVIDER_SITE_OTHER): Payer: No Payment, Other | Admitting: Psychiatry

## 2022-04-15 ENCOUNTER — Encounter (HOSPITAL_COMMUNITY): Payer: Self-pay | Admitting: Psychiatry

## 2022-04-15 DIAGNOSIS — F250 Schizoaffective disorder, bipolar type: Secondary | ICD-10-CM | POA: Diagnosis not present

## 2022-04-15 MED ORDER — INVEGA TRINZA 546 MG/1.75ML IM SUSY: 546.0000 mg | PREFILLED_SYRINGE | INTRAMUSCULAR | 2 refills | Status: DC

## 2022-04-15 NOTE — Progress Notes (Signed)
[place] MD/PA/NP OP Progress Note Virtual Visit via Telephone Note  I connected with John Burton on 04/15/22 at 11:30 AM EST by telephone and verified that I am speaking with the correct person using two identifiers.  Location: Patient: home Provider: Clinic   I discussed the limitations, risks, security and privacy concerns of performing an evaluation and management service by telephone and the availability of in person appointments. I also discussed with the patient that there may be a patient responsible charge related to this service. The patient expressed understanding and agreed to proceed.   I provided 30 minutes of non-face-to-face time during this encounter.  04/15/2022 10:02 AM John Burton  MRN:  [id number]  Chief Complaint: " I am doing okay"   HPI:  21 year old male seen today for follow-up psychiatric evaluation. 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 He is currently managed on Invega  Trinza 546 mg every 3 months. He informed writer his medication is effective in managing his psychiatric conditions.    Today he was unable to logon virtually so assessment done over the phone.  During exam he was pleasant, cooperative, and engaged in conversation.  He informed Probation officer that he is doing okay.  He notes that  his mood continues to be stable and notes that his anxiety and depression are minimal. Today provider conducted a GAD 7 and patient scored a 4, at his last visit she scored a 6. Provider also conducted a PHQ 9 and patient scored a 3, at his last visit he scored a 6. He endorses adequate sleep and appetite.  Today he denies SI/HI/VAH, mania, paranoia.   No medication changes made today. Patient agreeable to continue medications as prescribed. No other concerns noted at this time.     Visit Diagnosis:    ICD-10-CM   1. 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  Past Medical History: No past medical history on file.  Past Surgical History:  Procedure Laterality Date   LAPAROSCOPIC APPENDECTOMY N/A 04/18/2014   Procedure: APPENDECTOMY LAPAROSCOPIC;  Surgeon: Jerilynn Mages. Gerald Stabs, MD;  Location: [place];  Service: Pediatrics;  Laterality: N/A;    Family Psychiatric History: Denies  Family History: No family history on file.  Social History:  Social History   Socioeconomic History   Marital status: Single    Spouse name: Not on file   Number of children: Not on file   Years of education: Not on file   Highest education level: Not on file  Occupational History   Not on file  Tobacco Use   Smoking status: Never   Smokeless tobacco: Never  Substance and Sexual Activity   Alcohol use: No   Drug use: No   Sexual activity: Not on file  Other Topics Concern   Not on file  Social History Narrative   Not on file   Social Determinants of Health   Financial Resource Strain: Low Risk  (05/08/2021)   Overall Financial Resource Strain (CARDIA)    Difficulty of Paying Living Expenses: Not very hard  Food Insecurity: No Food Insecurity (05/08/2021)   Hunger Vital Sign    Worried About Running Out of Food in the Last Year: Never true  Ran Out of Food in the Last Year: Never true  Transportation Needs: No Transportation Needs (05/08/2021)   PRAPARE - Hydrologist (Medical): No    Lack of Transportation (Non-Medical): No  Physical Activity: Sufficiently Active (05/08/2021)   Exercise Vital Sign    Days of Exercise per Week: 5 days    Minutes of Exercise per Session: 120 min  Stress: Stress Concern Present (05/08/2021)   [place]    Feeling of Stress : Very much  Social Connections: Moderately Isolated (05/08/2021)   Social Connection and Isolation Panel [NHANES]    Frequency of Communication with Friends and Family: More than three times a week    Frequency of Social Gatherings with Friends and Family: More than three times a week    Attends Religious Services: More than 4 times per year    Active Member of [employer] or Organizations: No    Attends Archivist Meetings: Never    Marital Status: Never married    Allergies:  Allergies  Allergen Reactions   Shellfish Allergy Anaphylaxis and Swelling    Metabolic Disorder Labs: Lab Results  Component Value Date   HGBA1C 5.8 (H) 10/12/2021   MPG 119.76 03/07/2021   Lab Results  Component Value Date   PROLACTIN 56.2 (H) 10/12/2021   Lab Results  Component Value Date   CHOL 148 10/12/2021   TRIG 115 10/12/2021   HDL 38 (L) 10/12/2021   CHOLHDL 3.9 10/12/2021   VLDL 23 03/07/2021   LDLCALC 89 10/12/2021   LDLCALC 72 03/07/2021   Lab Results  Component Value Date   TSH 0.872 10/12/2021   TSH 2.787 03/07/2021    Therapeutic Level Labs: No results found for: "LITHIUM" Lab Results  Component Value Date   VALPROATE 46 (L) 10/12/2021   VALPROATE 119 (H) 03/07/2021   No results found for: "CBMZ"  Current Medications: Current Outpatient Medications  Medication Sig Dispense Refill   fluticasone (FLONASE) 50 MCG/ACT nasal spray Place 2 sprays into both nostrils daily. (Patient taking differently: Place 2 sprays into both nostrils daily as needed for allergies.) 16 g 0   ibuprofen (ADVIL) 200 MG tablet Take 800 mg by mouth every 6 (six) hours as needed (For back pain).     Lidocaine 4 % PTCH Apply 1 patch topically every 12 (twelve) hours as needed (For back pain). 30 patch 0   Paliperidone Palmitate ER (INVEGA TRINZA) 546 MG/1.75ML injection Inject 1.75 mLs (546 mg total) into the muscle every 3 (three) months.  1.8 mL 2   Current Facility-Administered Medications  Medication Dose Route Frequency Provider Last Rate Last Admin   Paliperidone Palmitate ER (INVEGA TRINZA) injection 546 mg  546 mg Intramuscular Once Rankin, Shuvon B, NP         Musculoskeletal: Strength & Muscle Tone:  Unable to assess due to telephone visit [place]:  Unable to assess due to telephone visit Patient leans: N/A  Psychiatric Specialty Exam: Review of Systems  There were no vitals taken for this visit.There is no height or weight on file to calculate BMI.  General Appearance:  Unable to assess due to telephone visit  Eye Contact:   Unable to assess due to telephone visit  Speech:  Clear and Coherent and Normal Rate  Volume:  Normal  Mood:  Euthymic  Affect:  Appropriate and Congruent  Thought Process:  Coherent, Irrelevant, and NA  Orientation:  Full (  Time, Place, and Person)  Thought Content: WDL and Logical   Suicidal Thoughts:  No  Homicidal Thoughts:  No  Memory:  Immediate;   Good Recent;   Good Remote;   Good  Judgement:  Good  Insight:  Good  Psychomotor Activity:   Unable to assess due to telephone visit  Concentration:  Concentration: Good and Attention Span: Good  Recall:  Good  Fund of Knowledge: Good  Language: Good  Akathisia:   Unable to assess due to telephone visit  Handed:  Right  AIMS (if indicated): not done  Assets:  Communication Skills Desire for Improvement Financial Resources/Insurance Housing Leisure Time Physical Health Social Support  ADL's:  Intact  Cognition: WNL  Sleep:  Good   Screenings: GAD-7    Flowsheet Row Video Visit from 04/15/2022 in [facility] Office Visit from 01/21/2022 in [facility] Counselor from 07/11/2021 in [facility] Counselor from 06/20/2021 in [facility] Counselor from 05/08/2021 in [facility]   Total GAD-7 Score 4 6 13 7 12       PHQ2-9    Flowsheet Row Video Visit from 04/15/2022 in [facility] Office Visit from 01/21/2022 in [facility] Counselor from 07/11/2021 in [facility] Counselor from 06/20/2021 in [facility] Office Visit from 06/19/2021 in [facility]  PHQ-2 Total Score 0 2 4 4  0  PHQ-9 Total Score 3 6 16 9  --      Flowsheet Row Office Visit from 06/19/2021 in [facility] Counselor from 05/08/2021 in [facility] ED from 03/07/2021 in [facility]  C-SSRS RISK CATEGORY No Risk No Risk Low Risk        Assessment and Plan: Patient notes that he is doing well on his current medication regimen. No medication changes made today. Patient agreeable to continue medications as prescribed.  1. Schizoaffective disorder, bipolar type ([facility])  Continue- paliperidone (INVEGA SUSTENNA) 156 MG/ML SUSY injection; Inject 1 mL (156 mg total) into the muscle every 30 (thirty) days.  Dispense: 1 mL; Refill: 11     Follow-up in 3 months Follow-up with therapy [facility], NP 04/15/2022, 10:02 AM

## 2022-04-16 ENCOUNTER — Other Ambulatory Visit (HOSPITAL_COMMUNITY): Payer: Self-pay | Admitting: Psychiatry

## 2022-04-16 ENCOUNTER — Encounter (HOSPITAL_COMMUNITY): Payer: Self-pay

## 2022-04-16 ENCOUNTER — Ambulatory Visit (HOSPITAL_COMMUNITY): Payer: 59

## 2022-04-16 VITALS — BP 137/89 | HR 85 | Resp 13 | Ht 72.0 in | Wt 236.0 lb

## 2022-04-16 DIAGNOSIS — F250 Schizoaffective disorder, bipolar type: Secondary | ICD-10-CM

## 2022-04-16 DIAGNOSIS — F411 Generalized anxiety disorder: Secondary | ICD-10-CM

## 2022-04-16 NOTE — Progress Notes (Signed)
Patient arrived  with Dad and Mom today for injection today -Patient stated Everything is going Good. Tolerated Paliperidone Palmitate ER SUSY 561.6 mg   [[id number]] Ordered Dose: 546 mg    Injection Well in Left Arm-- Pleasant as Always

## 2022-04-17 LAB — CBC WITH DIFFERENTIAL/PLATELET
Basophils Absolute: 0 10*3/uL (ref 0.0–0.2)
Basos: 1 %
EOS (ABSOLUTE): 0.1 10*3/uL (ref 0.0–0.4)
Eos: 1 %
Hematocrit: 45.6 % (ref 37.5–51.0)
Hemoglobin: 15.7 g/dL (ref 13.0–17.7)
Immature Grans (Abs): 0 10*3/uL (ref 0.0–0.1)
Immature Granulocytes: 0 %
Lymphocytes Absolute: 1.7 10*3/uL (ref 0.7–3.1)
Lymphs: 33 %
MCH: 30.5 pg (ref 26.6–33.0)
MCHC: 34.4 g/dL (ref 31.5–35.7)
MCV: 89 fL (ref 79–97)
Monocytes Absolute: 0.4 10*3/uL (ref 0.1–0.9)
Monocytes: 9 %
Neutrophils Absolute: 2.9 10*3/uL (ref 1.4–7.0)
Neutrophils: 56 %
Platelets: 257 10*3/uL (ref 150–450)
RBC: 5.14 x10E6/uL (ref 4.14–5.80)
RDW: 12.9 % (ref 11.6–15.4)
WBC: 5.1 10*3/uL (ref 3.4–10.8)

## 2022-04-17 LAB — BASIC METABOLIC PANEL
BUN/Creatinine Ratio: 12 (ref 9–20)
BUN: 12 mg/dL (ref 6–20)
CO2: 23 mmol/L (ref 20–29)
Calcium: 10.1 mg/dL (ref 8.7–10.2)
Chloride: 100 mmol/L (ref 96–106)
Creatinine, Ser: 1.03 mg/dL (ref 0.76–1.27)
Glucose: 99 mg/dL (ref 70–99)
Potassium: 4.7 mmol/L (ref 3.5–5.2)
Sodium: 142 mmol/L (ref 134–144)
eGFR: 105 mL/min/{1.73_m2} (ref >59)

## 2022-04-17 LAB — HEPATIC FUNCTION PANEL
ALT: 15 IU/L (ref 0–44)
AST: 15 IU/L (ref 0–40)
Albumin: 5.2 g/dL (ref 4.3–5.2)
Alkaline Phosphatase: 104 IU/L (ref 44–121)
Bilirubin Total: 0.7 mg/dL (ref 0.0–1.2)
Bilirubin, Direct: 0.18 mg/dL (ref 0.00–0.40)
Total Protein: 7.5 g/dL (ref 6.0–8.5)

## 2022-04-17 LAB — THYROID PANEL WITH TSH
Free Thyroxine Index: 1.8 (ref 1.2–4.9)
T3 Uptake Ratio: 30 % (ref 24–39)
T4, Total: 6 ug/dL (ref 4.5–12.0)
TSH: 1.34 u[IU]/mL (ref 0.450–4.500)

## 2022-07-09 ENCOUNTER — Telehealth (INDEPENDENT_AMBULATORY_CARE_PROVIDER_SITE_OTHER): Payer: 59 | Admitting: Psychiatry

## 2022-07-09 ENCOUNTER — Encounter (HOSPITAL_COMMUNITY): Payer: Self-pay | Admitting: Psychiatry

## 2022-07-09 DIAGNOSIS — F250 Schizoaffective disorder, bipolar type: Secondary | ICD-10-CM

## 2022-07-09 MED ORDER — INVEGA TRINZA 546 MG/1.75ML IM SUSY: 546.0000 mg | PREFILLED_SYRINGE | INTRAMUSCULAR | 2 refills | Status: DC

## 2022-07-09 NOTE — Progress Notes (Addendum)
BH MD/PA/NP OP Progress Note Virtual Visit via Video Note  I connected with John Burton on 07/09/22 at 10:00 AM EST by a video enabled telemedicine application and verified that I am speaking with the correct person using two identifiers.  Location: Patient: Home Provider: Clinic   I discussed the limitations of evaluation and management by telemedicine and the availability of in person appointments. The patient expressed understanding and agreed to proceed.  I provided 30 minutes of non-face-to-face time during this encounter.    07/09/2022 10:15 AM John Burton  MRN:  [id number]  Chief Complaint: " I need a letter for work"   HPI:  22 year old male seen today for follow-up psychiatric evaluation. 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 He is currently managed on Invega  Trinza 546 mg every 3 months. He informed writer his medication is effective in managing his psychiatric conditions.    Today he was well-groomed, pleasant, cooperative,  engaged in conversation, and maintained eye contact.  He informed Probation officer that he recently get a job offer at [employer] but has to pass a test prior to the job being secured.  He request that Probation officer give him an accommodations letter for extra time.  Provider was agreeable to this as patient notes that at times he has poor concentration, is distractible, and has difficulty focusing.  Patient also requested to be in a room without others.  Provider was agreeable to this.    Patient notes that overall he feels mentally stable.  Today provider conducted a GAD 7 and patient scored a 3, at his last visit she scored a 4. Provider also conducted a PHQ 9 and patient scored a 3, at his last visit he scored a 3. He endorses adequate sleep and appetite.  Today he denies SI/HI/VAH, mania, paranoia.   No medication changes made today.  Patient agreeable to continue medications as prescribed. No other concerns noted at this time.     Visit Diagnosis:    ICD-10-CM   1. 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  Past Medical History: History reviewed. No pertinent past medical history.  Past Surgical History:  Procedure Laterality Date   LAPAROSCOPIC APPENDECTOMY N/A 04/18/2014   Procedure: APPENDECTOMY LAPAROSCOPIC;  Surgeon: Jerilynn Mages. Gerald Stabs, MD;  Location: [facility];  Service: Pediatrics;  Laterality: N/A;    Family Psychiatric History: Denies  Family History: History reviewed. No pertinent family history.  Social History:  Social History   Socioeconomic History   Marital status: Single    Spouse name: Not on file   Number of children: Not on file   Years of education: Not on file   Highest education level: Not on file  Occupational History   Not on file  Tobacco Use   Smoking status: Never   Smokeless tobacco: Never  Substance and Sexual Activity   Alcohol use: No   Drug use: No   Sexual activity: Not on file  Other Topics Concern   Not on file  Social History Narrative   Not on file   Social Determinants of Health   Financial Resource Strain: Low Risk  (05/08/2021)   Overall Financial Resource Strain (  CARDIA)    Difficulty of Paying Living Expenses: Not very hard  Food Insecurity: No Food Insecurity (05/08/2021)   Hunger Vital Sign    Worried About Running Out of Food in the Last Year: Never true    Ran Out of Food in the Last Year: Never true  Transportation Needs: No Transportation Needs (05/08/2021)   PRAPARE - Hydrologist (Medical): No    Lack of Transportation  (Non-Medical): No  Physical Activity: Sufficiently Active (05/08/2021)   Exercise Vital Sign    Days of Exercise per Week: 5 days    Minutes of Exercise per Session: 120 min  Stress: Stress Concern Present (05/08/2021)   [place]    Feeling of Stress : Very much  Social Connections: Moderately Isolated (05/08/2021)   Social Connection and Isolation Panel [NHANES]    Frequency of Communication with Friends and Family: More than three times a week    Frequency of Social Gatherings with Friends and Family: More than three times a week    Attends Religious Services: More than 4 times per year    Active Member of [employer] or Organizations: No    Attends Archivist Meetings: Never    Marital Status: Never married    Allergies:  Allergies  Allergen Reactions   Shellfish Allergy Anaphylaxis and Swelling    Metabolic Disorder Labs: Lab Results  Component Value Date   HGBA1C 5.8 (H) 10/12/2021   MPG 119.76 03/07/2021   Lab Results  Component Value Date   PROLACTIN 56.2 (H) 10/12/2021   Lab Results  Component Value Date   CHOL 148 10/12/2021   TRIG 115 10/12/2021   HDL 38 (L) 10/12/2021   CHOLHDL 3.9 10/12/2021   VLDL 23 03/07/2021   LDLCALC 89 10/12/2021   LDLCALC 72 03/07/2021   Lab Results  Component Value Date   TSH 1.340 04/16/2022   TSH 0.872 10/12/2021    Therapeutic Level Labs: No results found for: "LITHIUM" Lab Results  Component Value Date   VALPROATE 46 (L) 10/12/2021   VALPROATE 119 (H) 03/07/2021   No results found for: "CBMZ"  Current Medications: Current Outpatient Medications  Medication Sig Dispense Refill   fluticasone (FLONASE) 50 MCG/ACT nasal spray Place 2 sprays into both nostrils daily. (Patient taking differently: Place 2 sprays into both nostrils daily as needed for allergies.) 16 g 0   ibuprofen (ADVIL) 200 MG tablet Take 800 mg by mouth every 6 (six) hours as  needed (For back pain).     Lidocaine 4 % PTCH Apply 1 patch topically every 12 (twelve) hours as needed (For back pain). 30 patch 0   Paliperidone Palmitate ER (INVEGA TRINZA) 546 MG/1.75ML injection Inject 1.75 mLs (546 mg total) into the muscle every 3 (three) months. 1.8 mL 2   No current facility-administered medications for this visit.     Musculoskeletal: Strength & Muscle Tone: within normal limits and telehealth visit Gait & Station: normal, telehealth visit Patient leans: N/A  Psychiatric Specialty Exam: Review of Systems  There were no vitals taken for this visit.There is no height or weight on file to calculate BMI.  General Appearance: Well Groomed  Eye Contact:  Good  Speech:  Clear and Coherent and Normal Rate  Volume:  Normal  Mood:  Euthymic  Affect:  Appropriate and Congruent  Thought Process:  Coherent, Goal Directed, and Linear  Orientation:  Full (Time, Place, and Person)  Thought  Content: WDL and Logical   Suicidal Thoughts:  No  Homicidal Thoughts:  No  Memory:  Immediate;   Good Recent;   Good Remote;   Good  Judgement:  Good  Insight:  Good  Psychomotor Activity:  Normal  Concentration:  Concentration: Good and Attention Span: Good  Recall:  Good  Fund of Knowledge: Good  Language: Good  Akathisia:  No  Handed:  Right  AIMS (if indicated): not done  Assets:  Communication Skills Desire for Improvement Financial Resources/Insurance Housing Leisure Time Physical Health Social Support  ADL's:  Intact  Cognition: WNL  Sleep:  Good   Screenings: GAD-7    Flowsheet Row Video Visit from 07/09/2022 in [facility] Video Visit from 04/15/2022 in [facility] Office Visit from 01/21/2022 in [facility] Counselor from 07/11/2021 in [facility] Counselor from 06/20/2021 in [facility]  Total GAD-7 Score 3 4 6 13  7      $ PHQ2-9    Flowsheet Row Video Visit from 07/09/2022 in [facility] Video Visit from 04/15/2022 in [facility] Office Visit from 01/21/2022 in [facility] Counselor from 07/11/2021 in [facility] Counselor from 06/20/2021 in [facility]  PHQ-2 Total Score 0 0 2 4 4  $ PHQ-9 Total Score 3 3 6 16 9      $ [place] Office Visit from 06/19/2021 in [facility] Counselor from 05/08/2021 in [facility] ED from 03/07/2021 in [place] No Risk No Risk Low Risk        Assessment and Plan: Patient notes that he is doing well on his current medication regimen. No medication changes made today. Patient agreeable to continue medications as prescribed.  Provider agreeable to writing patient accommodation letter for work. 1. Schizoaffective disorder, bipolar type ([place]) 1. Schizoaffective disorder, bipolar type ([facility])  Continue- Paliperidone Palmitate ER (INVEGA TRINZA) 546 MG/1.75ML injection; Inject 1.75 mLs (546 mg total) into the muscle every 3 (three) months.  Dispense: 1.8 mL; Refill: 2     Follow-up in 3 months Follow-up with therapy Salley Slaughter, NP 07/09/2022, 10:15 AM

## 2022-07-18 ENCOUNTER — Ambulatory Visit (INDEPENDENT_AMBULATORY_CARE_PROVIDER_SITE_OTHER): Payer: 59 | Admitting: *Deleted

## 2022-07-18 ENCOUNTER — Encounter (HOSPITAL_COMMUNITY): Payer: Self-pay

## 2022-07-18 VITALS — BP 127/71 | HR 77 | Ht 72.0 in | Wt 234.0 lb

## 2022-07-18 DIAGNOSIS — F250 Schizoaffective disorder, bipolar type: Secondary | ICD-10-CM

## 2022-07-18 MED ORDER — PALIPERIDONE PALMITATE ER 546 MG/1.75ML IM SUSY
546.0000 mg | PREFILLED_SYRINGE | Freq: Once | INTRAMUSCULAR | Status: AC
  Administered 2022-07-18: 546 mg via INTRAMUSCULAR

## 2022-07-18 NOTE — Progress Notes (Signed)
In with his dad today for his John Burton inj of 546 mg. Given today in his R DELTOID, he insisits on it being given in his arm even though we had a thorough discussion re the recommendation of his shot being given in his gluteal muscle. States he has not had issues with getting it in his arm previously. He applied for a job with [employer] but did not get it, he is not discouraged and plans to continue looking for a  full time job. Offered him [employer] to help with job placement but declined at this time, feels he can find a job on his own. He is to return in 3 months for his next shot. He has a scheduled appt virtually with Dr Ronne Binning in May. No complaints offered.

## 2022-07-19 ENCOUNTER — Other Ambulatory Visit (HOSPITAL_COMMUNITY): Payer: Self-pay

## 2022-09-13 ENCOUNTER — Telehealth (HOSPITAL_COMMUNITY): Payer: Self-pay

## 2022-09-18 NOTE — Telephone Encounter (Signed)
This patient is supposed to be on PT assistance , however he is on his parents insurance we can no long provide his injection for him and he needs to go to a different office\

## 2022-09-19 ENCOUNTER — Telehealth (HOSPITAL_COMMUNITY): Payer: 59 | Admitting: Psychiatry

## 2022-10-24 ENCOUNTER — Ambulatory Visit (HOSPITAL_COMMUNITY): Payer: 59

## 2022-11-26 ENCOUNTER — Encounter (HOSPITAL_COMMUNITY): Payer: Self-pay

## 2022-11-26 ENCOUNTER — Ambulatory Visit (HOSPITAL_COMMUNITY): Payer: 59

## 2022-11-26 ENCOUNTER — Ambulatory Visit (INDEPENDENT_AMBULATORY_CARE_PROVIDER_SITE_OTHER): Payer: 59 | Admitting: Psychiatry

## 2022-11-26 VITALS — BP 113/73 | HR 72 | Resp 16 | Ht 72.0 in | Wt 237.6 lb

## 2022-11-26 DIAGNOSIS — F200 Paranoid schizophrenia: Secondary | ICD-10-CM

## 2022-11-26 DIAGNOSIS — G4700 Insomnia, unspecified: Secondary | ICD-10-CM

## 2022-11-26 DIAGNOSIS — F250 Schizoaffective disorder, bipolar type: Secondary | ICD-10-CM

## 2022-11-26 DIAGNOSIS — F411 Generalized anxiety disorder: Secondary | ICD-10-CM

## 2022-11-26 MED ORDER — PALIPERIDONE PALMITATE ER 546 MG/1.75ML IM SUSY
546.0000 mg | PREFILLED_SYRINGE | Freq: Once | INTRAMUSCULAR | Status: AC
  Administered 2022-11-26: 546 mg via INTRAMUSCULAR

## 2022-11-26 NOTE — Progress Notes (Unsigned)
Patient arrived  with Dad and Mom today for injection today -Patient stated Everything is going Good. Tolerated Paliperidone Palmitate ER SUSY 561.6 mg   Ordered Dose: 546 mg    Injection Well in Left Arm-- Pleasant as Always

## 2022-11-26 NOTE — Progress Notes (Incomplete)
BH MD/PA/NP OP Progress Note Virtual Visit via Video Note  I connected with John Burton on 11/26/22 at  9:30 AM EDT by a video enabled telemedicine application and verified that I am speaking with the correct person using two identifiers.  Location: Patient: Home Provider: Clinic   I discussed the limitations of evaluation and management by telemedicine and the availability of in person appointments. The patient expressed understanding and agreed to proceed.  I provided 30 minutes of non-face-to-face time during this encounter.    11/26/2022 11:00 AM John Burton  MRN:  [id number]  Chief Complaint: " I need a letter for work"   HPI:  22 year old male seen today for follow-up psychiatric evaluation. 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 He is currently managed on Invega  Trinza 546 mg every 3 months. He informed writer his medication is effective in managing his psychiatric conditions.    Today he was well-groomed, pleasant, cooperative,  engaged in conversation, and maintained eye contact.  He informed Clinical research associate that he recently get a job offer at [employer] but has to pass a test prior to the job being secured.  He request that Clinical research associate give him an accommodations letter for extra time.  Provider was agreeable to this as patient notes that at times he has poor concentration, is distractible, and has difficulty focusing.  Patient also requested to be in a room without others.  Provider was agreeable to this.    Patient notes that overall he feels mentally stable.  Today provider conducted a GAD 7 and patient scored a 3, at his last visit she scored a 4. Provider also conducted a PHQ 9 and patient scored a 3, at his last visit he scored a 3. He endorses adequate sleep and appetite.  Today he denies SI/HI/VAH, mania, paranoia.   No medication changes made today.  Patient agreeable to continue medications as prescribed. No other concerns noted at this time.     Visit Diagnosis:  No diagnosis found.   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  Past Medical History: History reviewed. No pertinent past medical history.  Past Surgical History:  Procedure Laterality Date  . LAPAROSCOPIC APPENDECTOMY N/A 04/18/2014   Procedure: APPENDECTOMY LAPAROSCOPIC;  Surgeon: Judie Petit. Leonia Corona, MD;  Location: [facility];  Service: Pediatrics;  Laterality: N/A;    Family Psychiatric History: Denies  Family History: History reviewed. No pertinent family history.  Social History:  Social History   Socioeconomic History  . Marital status: Single    Spouse name: Not on file  . Number of children: Not on file  . Years of education: Not on file  . Highest education level: Not on file  Occupational History  . Not on file  Tobacco Use  . Smoking status: Never  . Smokeless tobacco: Never  Substance and Sexual Activity  . Alcohol use: No  . Drug use: No  . Sexual activity: Not on file  Other Topics Concern  . Not on file  Social History Narrative  . Not on file   Social Determinants of Health   Financial Resource Strain: Low Risk  (05/08/2021)   Overall Financial Resource Strain (CARDIA)   . Difficulty of Paying Living Expenses: Not very hard  Food Insecurity: No Food Insecurity (05/08/2021)  Hunger Vital Sign   . Worried About Programme researcher, broadcasting/film/video in the Last Year: Never true   . Ran Out of Food in the Last Year: Never true  Transportation Needs: No Transportation Needs (05/08/2021)   PRAPARE - Transportation   . Lack of Transportation (Medical): No   . Lack of Transportation (Non-Medical): No  Physical Activity: Sufficiently Active (05/08/2021)   Exercise Vital Sign   . Days of  Exercise per Week: 5 days   . Minutes of Exercise per Session: 120 min  Stress: Stress Concern Present (05/08/2021)   [employer] of Occupational Health - Occupational Stress Questionnaire   . Feeling of Stress : Very much  Social Connections: Moderately Isolated (05/08/2021)   Social Connection and Isolation Panel [NHANES]   . Frequency of Communication with Friends and Family: More than three times a week   . Frequency of Social Gatherings with Friends and Family: More than three times a week   . Attends Religious Services: More than 4 times per year   . Active Member of Clubs or Organizations: No   . Attends Banker Meetings: Never   . Marital Status: Never married    Allergies:  Allergies  Allergen Reactions  . Shellfish Allergy Anaphylaxis and Swelling    Metabolic Disorder Labs: Lab Results  Component Value Date   HGBA1C 5.8 (H) 10/12/2021   MPG 119.76 03/07/2021   Lab Results  Component Value Date   PROLACTIN 56.2 (H) 10/12/2021   Lab Results  Component Value Date   CHOL 148 10/12/2021   TRIG 115 10/12/2021   HDL 38 (L) 10/12/2021   CHOLHDL 3.9 10/12/2021   VLDL 23 03/07/2021   LDLCALC 89 10/12/2021   LDLCALC 72 03/07/2021   Lab Results  Component Value Date   TSH 1.340 04/16/2022   TSH 0.872 10/12/2021    Therapeutic Level Labs: No results found for: "LITHIUM" Lab Results  Component Value Date   VALPROATE 46 (L) 10/12/2021   VALPROATE 119 (H) 03/07/2021   No results found for: "CBMZ"  Current Medications: Current Outpatient Medications  Medication Sig Dispense Refill  . fluticasone (FLONASE) 50 MCG/ACT nasal spray Place 2 sprays into both nostrils daily. (Patient taking differently: Place 2 sprays into both nostrils daily as needed for allergies.) 16 g 0  . ibuprofen (ADVIL) 200 MG tablet Take 800 mg by mouth every 6 (six) hours as needed (For back pain).    . Lidocaine 4 % PTCH Apply 1 patch topically every 12 (twelve) hours as  needed (For back pain). 30 patch 0  . Paliperidone Palmitate ER (INVEGA TRINZA) 546 MG/1.75ML injection Inject 1.75 mLs (546 mg total) into the muscle every 3 (three) months. 1.8 mL 2   No current facility-administered medications for this visit.     Musculoskeletal: Strength & Muscle Tone: within normal limits and telehealth visit Gait & Station: normal, telehealth visit Patient leans: N/A  Psychiatric Specialty Exam: Review of Systems  Blood pressure 133/82, pulse 99, resp. rate 16, height 6' (1.829 m), weight 237 lb 9.6 oz (107.8 kg), SpO2 100 %.Body mass index is 32.22 kg/m.  General Appearance: Well Groomed  Eye Contact:  Good  Speech:  Clear and Coherent and Normal Rate  Volume:  Normal  Mood:  Euthymic  Affect:  Appropriate and Congruent  Thought Process:  Coherent, Goal Directed, and Linear  Orientation:  Full (Time, Place, and Person)  Thought Content: WDL and Logical   Suicidal Thoughts:  No  Homicidal  Thoughts:  No  Memory:  Immediate;   Good Recent;   Good Remote;   Good  Judgement:  Good  Insight:  Good  Psychomotor Activity:  Normal  Concentration:  Concentration: Good and Attention Span: Good  Recall:  Good  Fund of Knowledge: Good  Language: Good  Akathisia:  No  Handed:  Right  AIMS (if indicated): not done  Assets:  Communication Skills Desire for Improvement Financial Resources/Insurance Housing Leisure Time Physical Health Social Support  ADL's:  Intact  Cognition: WNL  Sleep:  Good   Screenings: GAD-7    Flowsheet Row Video Visit from 07/09/2022 in [facility] Video Visit from 04/15/2022 in [facility] Office Visit from 01/21/2022 in [facility] Counselor from 07/11/2021 in [facility] Counselor from 06/20/2021 in [facility]  Total GAD-7 Score 3 4 6 13 7       PHQ2-9    Flowsheet Row Video Visit  from 07/09/2022 in [facility] Video Visit from 04/15/2022 in [facility] Office Visit from 01/21/2022 in [facility] Counselor from 07/11/2021 in [facility] Counselor from 06/20/2021 in [facility]  PHQ-2 Total Score 0 0 2 4 4   PHQ-9 Total Score 3 3 6 16 9       Flowsheet Row Office Visit from 06/19/2021 in [facility] Counselor from 05/08/2021 in [facility] from 03/07/2021 in [facility]  C-SSRS RISK CATEGORY No Risk No Risk Low Risk        Assessment and Plan: Patient notes that he is doing well on his current medication regimen. No medication changes made today. Patient agreeable to continue medications as prescribed.  Provider agreeable to writing patient accommodation letter for work. 1. Schizoaffective disorder, bipolar type ([facility]) 1. Schizoaffective disorder, bipolar type ([facility])  Continue- Paliperidone Palmitate ER (INVEGA TRINZA) 546 MG/1.75ML injection; Inject 1.75 mLs (546 mg total) into the muscle every 3 (three) months.  Dispense: 1.8 mL; Refill: 2     Follow-up in 3 months Follow-up with therapy Shanna Cisco, NP 11/26/2022, 11:00 AM

## 2022-11-27 ENCOUNTER — Encounter (HOSPITAL_COMMUNITY): Payer: Self-pay

## 2022-11-27 MED ORDER — INVEGA TRINZA 546 MG/1.75ML IM SUSY: 546.0000 mg | PREFILLED_SYRINGE | INTRAMUSCULAR | 2 refills | Status: DC

## 2022-11-27 NOTE — Progress Notes (Signed)
BH MD/PA/NP OP Progress Note     11/27/2022 8:38 AM John Burton  MRN:  [id number]  Chief Complaint: "I dont notice a difference without my shot" Per Dad "The shot makes him more normal" Per mom "He wants to come off it eventually"   HPI:  22 year old male seen today for follow-up psychiatric evaluation. 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 He is currently managed on Invega  Trinza 546 mg every 3 months. He informed writer his medication is effective in managing his psychiatric conditions but notes he is about a month late for his injection.    Today he was well-groomed, pleasant, cooperative,  engaged in conversation, and maintained eye contact.  He informed Clinical research associate that since being off his injection he has not noticed any difference. His father reports that he is normal with his injection and fears him slipping into psychosis again. His father notes that recently he has been doing bizarre things such as staring off in space for a long period. He also notes that his thinking can be disorganized and he is inattentive. His mother notes that the injection makes him slower but reports that she fears he needs it more than he will admit. She reports that they will be traveling to [place] and fears he will have a psychotic episode in another country. She also notes that he maybe moving back to [place] with her to do online training for a job and wants him mentally stable.   Patient endorses vaping [facility] and tobacco. Provider informed patient that [facility] can exacerbate his mental health.  He endorsed understanding.  He denies alcohol or other illegal drug use  Provider informed patient that he has tapered off of Invega since he has not had it in about a month. Provider offered patient oral Hinda Glatter as he was trusting of his parents perspective of  his bizarre behaviors. He however reports that he will not take it consistently.  He notes that he prefers getting his injection.  Provider discussed risk and benefits of giving patient Invega injection after he has not had in a while.  He endorsed but notes that he still wanted it .  Invega trends up 546 mg restarted.  Provider suggested that patient stay and have vitals taken after 30 minutes of administration.  He was agreeable to this.  Vitals were within normal limits.  No noted side effects.  Patient instructed to go to the ED if he develops intense side effects.  He endorsed understanding and agreed.  Patient notes that he may want to taper off of medication in the future.  Provider was agreeable.  No other concerns noted at this time.    Visit Diagnosis:    ICD-10-CM   1. 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  Past Medical History: No past medical history on file.  Past Surgical History:  Procedure Laterality Date   LAPAROSCOPIC APPENDECTOMY N/A 04/18/2014   Procedure: APPENDECTOMY LAPAROSCOPIC;  Surgeon: Judie Petit. Leonia Corona, MD;  Location: [facility] OR;  Service: Pediatrics;  Laterality: N/A;    Family Psychiatric History: Denies  Family History: No family history on file.  Social History:  Social History   Socioeconomic History   Marital status: Single    Spouse name: Not on file   Number of children: Not on file   Years of education: Not on file   Highest education level: Not on file  Occupational History   Not on file  Tobacco Use   Smoking status: Never   Smokeless tobacco: Never  Substance and Sexual Activity   Alcohol use: No   Drug use: No   Sexual activity: Not on file   Other Topics Concern   Not on file  Social History Narrative   Not on file   Social Determinants of Health   Financial Resource Strain: Low Risk  (05/08/2021)   Overall Financial Resource Strain (CARDIA)    Difficulty of Paying Living Expenses: Not very hard  Food Insecurity: No Food Insecurity (05/08/2021)   Hunger Vital Sign    Worried About Running Out of Food in the Last Year: Never true    Ran Out of Food in the Last Year: Never true  Transportation Needs: No Transportation Needs (05/08/2021)   PRAPARE - Administrator, Civil Service (Medical): No    Lack of Transportation (Non-Medical): No  Physical Activity: Sufficiently Active (05/08/2021)   Exercise Vital Sign    Days of Exercise per Week: 5 days    Minutes of Exercise per Session: 120 min  Stress: Stress Concern Present (05/08/2021)   [employer] of Occupational Health - Occupational Stress Questionnaire    Feeling of Stress : Very much  Social Connections: Moderately Isolated (05/08/2021)   Social Connection and Isolation Panel [NHANES]    Frequency of Communication with Friends and Family: More than three times a week    Frequency of Social Gatherings with Friends and Family: More than three times a week    Attends Religious Services: More than 4 times per year    Active Member of [employer] or Organizations: No    Attends Banker Meetings: Never    Marital Status: Never married    Allergies:  Allergies  Allergen Reactions   Shellfish Allergy Anaphylaxis and Swelling    Metabolic Disorder Labs: Lab Results  Component Value Date   HGBA1C 5.8 (H) 10/12/2021   MPG 119.76 03/07/2021   Lab Results  Component Value Date   PROLACTIN 56.2 (H) 10/12/2021   Lab Results  Component Value Date   CHOL 148 10/12/2021   TRIG 115 10/12/2021   HDL 38 (L) 10/12/2021   CHOLHDL 3.9 10/12/2021   VLDL 23 03/07/2021   LDLCALC 89 10/12/2021   LDLCALC 72 03/07/2021   Lab Results  Component  Value Date   TSH 1.340 04/16/2022   TSH 0.872 10/12/2021    Therapeutic Level Labs: No results found for: "LITHIUM" Lab Results  Component Value Date   VALPROATE 46 (L) 10/12/2021   VALPROATE 119 (H) 03/07/2021   No results found for: "CBMZ"  Current Medications: Current Outpatient Medications  Medication Sig Dispense Refill   fluticasone (FLONASE) 50 MCG/ACT nasal spray Place 2 sprays into both nostrils daily. (Patient taking differently: Place 2 sprays into both nostrils daily as needed for allergies.) 16 g 0   ibuprofen (ADVIL) 200 MG tablet Take 800 mg by mouth every 6 (six) hours as needed (For back pain).     Lidocaine 4 % PTCH Apply 1 patch topically every 12 (twelve) hours as needed (For back pain). 30 patch 0   Paliperidone Palmitate ER (INVEGA TRINZA) 546 MG/1.75ML  injection Inject 1.75 mLs (546 mg total) into the muscle every 3 (three) months. 1.8 mL 2   No current facility-administered medications for this visit.     Musculoskeletal: Strength & Muscle Tone: within normal limits Gait & Station: normal Patient leans: N/A  Psychiatric Specialty Exam: Review of Systems  There were no vitals taken for this visit.There is no height or weight on file to calculate BMI.  General Appearance: Well Groomed  Eye Contact:  Good  Speech:  Clear and Coherent and Normal Rate  Volume:  Normal  Mood:  Euthymic  Affect:  Appropriate and Congruent  Thought Process:  Coherent, Goal Directed, and Linear  Orientation:  Full (Time, Place, and Person)  Thought Content: WDL and Logical   Suicidal Thoughts:  No  Homicidal Thoughts:  No  Memory:  Immediate;   Good Recent;   Good Remote;   Good  Judgement:  Good  Insight:  Good  Psychomotor Activity:  Normal  Concentration:  Concentration: Good and Attention Span: Good  Recall:  Good  Fund of Knowledge: Good  Language: Good  Akathisia:  No  Handed:  Right  AIMS (if indicated): done, 0  Assets:  Communication Skills Desire for  Improvement Financial Resources/Insurance Housing Leisure Time Physical Health Social Support  ADL's:  Intact  Cognition: WNL  Sleep:  Good   Screenings: GAD-7    Flowsheet Row Office Visit from 11/26/2022 in [facility] Video Visit from 07/09/2022 in [facility] Video Visit from 04/15/2022 in [facility] Office Visit from 01/21/2022 in [facility] Counselor from 07/11/2021 in [facility]  Total GAD-7 Score 3 3 4 6 13       PHQ2-9    Flowsheet Row Office Visit from 11/26/2022 in [facility] Video Visit from 07/09/2022 in [facility] Video Visit from 04/15/2022 in [facility] Office Visit from 01/21/2022 in [facility] Counselor from 07/11/2021 in [facility]  PHQ-2 Total Score 0 0 0 2 4  PHQ-9 Total Score 4 3 3 6 16       Flowsheet Row Office Visit from 06/19/2021 in [facility] Counselor from 05/08/2021 in [facility] ED from 03/07/2021 in [facility]  C-SSRS RISK CATEGORY No Risk No Risk Low Risk        Assessment and Plan: Patient reports that he does not feel a difference since being off of Invega.  His parents however reports that he has been presenting with some bizarre like behavior.  Provider informed patient that he has tapered off of Invega since he has not had it in about a month. Provider offered patient oral Hinda Glatter as he was trusting of his parents perspective of his bizarre behaviors. He however reports that he will not take it consistently.  He notes that he prefers getting his injection.  Provider discussed risk and benefits of giving patient Invega injection after he has not had in a while.  He endorsed but  notes that he still wanted it .  Invega trends up 546 mg restarted.  Provider suggested that patient stay and have vitals taken after 30 minutes of administration.  He was agreeable to this.  Vitals were within normal limits.  No noted side effects.  Patient instructed to go to the ED if he develops intense side effects.  He endorsed understanding and agreed.  Patient notes that he may want to taper off of medication in the future.  Provider was agreeable.   1. Schizoaffective disorder, bipolar type (HCC)  Continue/restart- Paliperidone Palmitate ER (INVEGA TRINZA) 546 MG/1.75ML injection; Inject 1.75 mLs (546 mg total) into the muscle every 3 (three) months.  Dispense: 1.8 mL; Refill: 2     Follow-up in 3 months Follow-up with therapy Shanna Cisco, NP 11/27/2022, 8:38 AM

## 2023-02-26 ENCOUNTER — Ambulatory Visit (HOSPITAL_COMMUNITY): Payer: 59

## 2023-02-26 ENCOUNTER — Encounter (HOSPITAL_COMMUNITY): Payer: 59 | Admitting: Psychiatry

## 2023-03-06 ENCOUNTER — Encounter (HOSPITAL_COMMUNITY): Payer: Self-pay

## 2023-03-06 ENCOUNTER — Encounter (HOSPITAL_COMMUNITY): Payer: Self-pay | Admitting: Psychiatry

## 2023-03-06 ENCOUNTER — Ambulatory Visit (INDEPENDENT_AMBULATORY_CARE_PROVIDER_SITE_OTHER): Payer: 59

## 2023-03-06 ENCOUNTER — Other Ambulatory Visit (HOSPITAL_COMMUNITY): Payer: Self-pay

## 2023-03-06 ENCOUNTER — Ambulatory Visit (INDEPENDENT_AMBULATORY_CARE_PROVIDER_SITE_OTHER): Payer: 59 | Admitting: Psychiatry

## 2023-03-06 VITALS — BP 146/95 | HR 78 | Resp 16 | Ht 73.0 in | Wt 223.0 lb

## 2023-03-06 DIAGNOSIS — F250 Schizoaffective disorder, bipolar type: Secondary | ICD-10-CM

## 2023-03-06 MED ORDER — PALIPERIDONE PALMITATE ER 546 MG/1.75ML IM SUSY
546.0000 mg | PREFILLED_SYRINGE | INTRAMUSCULAR | Status: AC
  Administered 2023-03-06: 546 mg via INTRAMUSCULAR

## 2023-03-06 MED ORDER — INVEGA TRINZA 546 MG/1.75ML IM SUSY: 546.0000 mg | PREFILLED_SYRINGE | INTRAMUSCULAR | 11 refills | Status: AC

## 2023-03-06 NOTE — Progress Notes (Cosign Needed)
Patient arrived  with Dad today for injection today -Patient stated Everything is going Good. Tolerated Paliperidone Palmitate ER SUSY 561.6 mg   Ordered Dose: 546 mg

## 2023-03-06 NOTE — Progress Notes (Signed)
BH MD/PA/NP OP Progress Note     03/06/2023 10:22 AM John Burton  MRN:  [id number]  Chief Complaint: "I am doing good" Per Dad "He needs a job"    HPI:  22 year old male seen today for follow-up psychiatric evaluation. 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 He is currently managed on Invega  Trinza 546 mg every 3 months. He informed writer his medication is effective in managing his psychiatric conditions.   Today he was well-groomed, pleasant, cooperative,  engaged in conversation, and maintained eye contact.  He informed Clinical research associate that he is doing good.  He notes that he spends most of his days playing video games.  He also notes that he has been more active and exercising.  He informed Clinical research associate that yesterday he walked 6 miles.  Patient notes that he is trying to taking tech courses for a few months and hopeful that he will be able to get a job.  Patient also notes that he wants to go back to school and get his bachelor's in biology.  He informed Clinical research associate that he had to leave school after having a psychotic break in school.  He however notes that he does not wish to pursue a career in biology.  Patient's father notes that he is hopeful that he will major in something else that will lead him to a stable job and career.    Patient's father informed Clinical research associate that the patient lacks motivation to do things that he should.  He reports that he relies heavily on himself and his wife for financial needs.  He informed Clinical research associate that he pays the rent, electricity, and his son cell phone bill.  He notes that because his son gives plasma 2 times a week and gets $106 he feels that that is enough to contribute to the family. He states that he needs a job. Patient's father notes that in the spring he and his mother has discussed putting him out of the home  and withdrawing support.  Patient informed Clinical research associate that he likes the financial support that he gets from his family.  Provider encouraged patient to find a job and be self-sufficient.  He notes that he will try.    Patient continues to vape [facility] and tobacco.  He informed Clinical research associate that it calms him down.  His father however notes that it makes him more irritable and reports that he impulsively spends his money on the substances. Provider informed patient that [facility] can exacerbate his mental health.  He endorsed understanding.  He denies alcohol or other illegal drug use  Patient notes that his anxiety and depression are well-managed.  Today provider conducted a GAD-7 and patient scored a 5, at his last visit he had a 3.  Provide also conducted PHQ-9 and  Patient scored a 3.  He endorses adequate sleep and appetite.  Today he denies SI/HI/AVH, mania, paranoia.  Patient informed Clinical research associate that his trip to [place] was amazing.  He notes that he enjoyed alcohol and his water park.  At this time patient wishes to continue his medication as prescribed.  No medication changes made today.  Patient agreeable to continue medication as prescribed.  No other concerns noted at this time.    Visit Diagnosis:    ICD-10-CM   1. Schizoaffective disorder, bipolar type (HCC)  F25.0 Paliperidone Palmitate ER (INVEGA TRINZA) 546 MG/1.75ML  injection    Paliperidone Palmitate ER (INVEGA TRINZA) injection 546 mg        Past Psychiatric History: Schizoaffective disorder bipolar type, anxiety, depression, SI/HI, ADHD, THC use, possible stimulant misuse (mother noted patient taking more than prescribed), depression, and alcohol use.  Past Medical History: No past medical history on file.  Past Surgical History:  Procedure Laterality Date   LAPAROSCOPIC APPENDECTOMY N/A 04/18/2014   Procedure: APPENDECTOMY LAPAROSCOPIC;  Surgeon: Judie Petit. Leonia Corona, MD;  Location: [facility] OR;  Service: Pediatrics;  Laterality: N/A;    Family  Psychiatric History: Denies  Family History: No family history on file.  Social History:  Social History   Socioeconomic History   Marital status: Single    Spouse name: Not on file   Number of children: Not on file   Years of education: Not on file   Highest education level: Not on file  Occupational History   Not on file  Tobacco Use   Smoking status: Never   Smokeless tobacco: Never  Substance and Sexual Activity   Alcohol use: No   Drug use: No   Sexual activity: Not on file  Other Topics Concern   Not on file  Social History Narrative   Not on file   Social Determinants of Health   Financial Resource Strain: Low Risk  (05/08/2021)   Overall Financial Resource Strain (CARDIA)    Difficulty of Paying Living Expenses: Not very hard  Food Insecurity: No Food Insecurity (05/08/2021)   Hunger Vital Sign    Worried About Running Out of Food in the Last Year: Never true    Ran Out of Food in the Last Year: Never true  Transportation Needs: No Transportation Needs (05/08/2021)   PRAPARE - Administrator, Civil Service (Medical): No    Lack of Transportation (Non-Medical): No  Physical Activity: Sufficiently Active (05/08/2021)   Exercise Vital Sign    Days of Exercise per Week: 5 days    Minutes of Exercise per Session: 120 min  Stress: Stress Concern Present (05/08/2021)   [employer] of Occupational Health - Occupational Stress Questionnaire    Feeling of Stress : Very much  Social Connections: Moderately Isolated (05/08/2021)   Social Connection and Isolation Panel [NHANES]    Frequency of Communication with Friends and Family: More than three times a week    Frequency of Social Gatherings with Friends and Family: More than three times a week    Attends Religious Services: More than 4 times per year    Active Member of [employer] or Organizations: No    Attends Banker Meetings: Never    Marital Status: Never married    Allergies:   Allergies  Allergen Reactions   Shellfish Allergy Anaphylaxis and Swelling    Metabolic Disorder Labs: Lab Results  Component Value Date   HGBA1C 5.8 (H) 10/12/2021   MPG 119.76 03/07/2021   Lab Results  Component Value Date   PROLACTIN 56.2 (H) 10/12/2021   Lab Results  Component Value Date   CHOL 148 10/12/2021   TRIG 115 10/12/2021   HDL 38 (L) 10/12/2021   CHOLHDL 3.9 10/12/2021   VLDL 23 03/07/2021   LDLCALC 89 10/12/2021   LDLCALC 72 03/07/2021   Lab Results  Component Value Date   TSH 1.340 04/16/2022   TSH 0.872 10/12/2021    Therapeutic Level Labs: No results found for: "LITHIUM" Lab Results  Component Value Date   VALPROATE 46 (L) 10/12/2021   VALPROATE  119 (H) 03/07/2021   No results found for: "CBMZ"  Current Medications: Current Outpatient Medications  Medication Sig Dispense Refill   fluticasone (FLONASE) 50 MCG/ACT nasal spray Place 2 sprays into both nostrils daily. (Patient taking differently: Place 2 sprays into both nostrils daily as needed for allergies.) 16 g 0   ibuprofen (ADVIL) 200 MG tablet Take 800 mg by mouth every 6 (six) hours as needed (For back pain).     Lidocaine 4 % PTCH Apply 1 patch topically every 12 (twelve) hours as needed (For back pain). 30 patch 0   Paliperidone Palmitate ER (INVEGA TRINZA) 546 MG/1.75ML injection Inject 1.75 mLs (546 mg total) into the muscle every 3 (three) months. 546 mL 11   Current Facility-Administered Medications  Medication Dose Route Frequency Provider Last Rate Last Admin   Paliperidone Palmitate ER (INVEGA TRINZA) injection 546 mg  546 mg Intramuscular Q90 days          Musculoskeletal: Strength & Muscle Tone: within normal limits Gait & Station: normal Patient leans: N/A  Psychiatric Specialty Exam: Review of Systems  Blood pressure (!) 146/95, pulse 78, temperature 98.4 F (36.9 C), height 6\' 1"  (1.854 m), weight 223 lb 12.8 oz (101.5 kg), SpO2 100%.Body mass index is 29.53 kg/m.   General Appearance: Well Groomed  Eye Contact:  Good  Speech:  Clear and Coherent and Normal Rate  Volume:  Normal  Mood:  Euthymic  Affect:  Appropriate and Congruent  Thought Process:  Coherent, Goal Directed, and Linear  Orientation:  Full (Time, Place, and Person)  Thought Content: WDL and Logical   Suicidal Thoughts:  No  Homicidal Thoughts:  No  Memory:  Immediate;   Good Recent;   Good Remote;   Good  Judgement:  Good  Insight:  Good  Psychomotor Activity:  Normal  Concentration:  Concentration: Good and Attention Span: Good  Recall:  Good  Fund of Knowledge: Good  Language: Good  Akathisia:  No  Handed:  Right  AIMS (if indicated): done, 0  Assets:  Communication Skills Desire for Improvement Financial Resources/Insurance Housing Leisure Time Physical Health Social Support  ADL's:  Intact  Cognition: WNL  Sleep:  Good   Screenings: AIMS    Flowsheet Row Clinical Support from 03/06/2023 in [facility]  AIMS Total Score 0      GAD-7    Flowsheet Row Clinical Support from 03/06/2023 in [facility] Office Visit from 11/26/2022 in [facility] Video Visit from 07/09/2022 in [facility] Video Visit from 04/15/2022 in [facility] Office Visit from 01/21/2022 in [facility]  Total GAD-7 Score 5 3 3 4 6       PHQ2-9    Flowsheet Row Clinical Support from 03/06/2023 in [facility] Office Visit from 11/26/2022 in [facility] Video Visit from 07/09/2022 in [facility] Video Visit from 04/15/2022 in [facility] Office Visit from 01/21/2022 in [facility]  PHQ-2 Total Score 0 0 0 0 2  PHQ-9 Total Score 3 4 3 3 6       Flowsheet Row Office Visit from 06/19/2021  in [facility] Counselor from 05/08/2021 in [facility] ED from 03/07/2021 in [facility]  C-SSRS RISK CATEGORY No Risk No Risk Low Risk  Assessment and Plan: Patient reports that he is doing well on his current medication regimen. At this time patient wishes to continue his medication as prescribed.  No medication changes made today.  Patient agreeable to continue medication as prescribed.   1. Schizoaffective disorder, bipolar type (HCC)  Continue/restart- Paliperidone Palmitate ER (INVEGA TRINZA) 546 MG/1.75ML injection; Inject 1.75 mLs (546 mg total) into the muscle every 3 (three) months.  Dispense: 1.8 mL; Refill: 11     Follow-up in 3 months Follow-up with therapy Shanna Cisco, NP 03/06/2023, 10:22 AM

## 2023-03-07 ENCOUNTER — Telehealth (HOSPITAL_COMMUNITY): Payer: Self-pay | Admitting: *Deleted

## 2023-03-07 NOTE — Telephone Encounter (Signed)
Fax received for prior authorization of Invega Trinza 546mg . Called [facility] spoke with [place] S who states someone will callback for clinical questions. Awaiting call.

## 2023-03-11 ENCOUNTER — Telehealth (HOSPITAL_COMMUNITY): Payer: Self-pay | Admitting: *Deleted

## 2023-03-11 NOTE — Telephone Encounter (Signed)
Called patients insurance to ask about prior authorization of John Burton. Was told that pharmacy is running it through the diabetic plan and not his pica plan. [name] states that it will be $145 due to having a $100 deductible, once met it will be $45. Called to notify pharmacy.

## 2023-03-12 NOTE — Telephone Encounter (Signed)
Thanks for the update

## 2023-05-29 ENCOUNTER — Encounter (HOSPITAL_COMMUNITY): Payer: 59 | Admitting: Psychiatry

## 2023-06-05 ENCOUNTER — Encounter (HOSPITAL_COMMUNITY): Payer: 59 | Admitting: Psychiatry

## 2023-07-24 ENCOUNTER — Telehealth (HOSPITAL_COMMUNITY): Payer: Self-pay

## 2023-10-23 NOTE — Telephone Encounter (Signed)
 Open in error
